# Patient Record
Sex: Female | Born: 1985 | Race: Black or African American | Hispanic: No | Marital: Single | Smoking: Never smoker
Health system: Southern US, Community
[De-identification: ages and names within clinical notes are randomized; demographics above are authoritative.]

## PROBLEM LIST (undated history)

## (undated) DIAGNOSIS — E119 Type 2 diabetes mellitus without complications: Secondary | ICD-10-CM

## (undated) DIAGNOSIS — K219 Gastro-esophageal reflux disease without esophagitis: Secondary | ICD-10-CM

## (undated) DIAGNOSIS — I10 Essential (primary) hypertension: Secondary | ICD-10-CM

## (undated) HISTORY — PX: OVARIAN CYST REMOVAL: SHX89

## (undated) HISTORY — DX: Essential (primary) hypertension: I10

## (undated) HISTORY — DX: Type 2 diabetes mellitus without complications: E11.9

## (undated) HISTORY — PX: CYST EXCISION: SHX5701

---

## 2010-01-13 ENCOUNTER — Emergency Department: Payer: Self-pay | Admitting: Emergency Medicine

## 2010-04-20 ENCOUNTER — Emergency Department: Payer: Self-pay | Admitting: Emergency Medicine

## 2010-06-20 ENCOUNTER — Emergency Department: Payer: Self-pay | Admitting: Internal Medicine

## 2013-02-17 LAB — HM HIV SCREENING LAB: HM HIV Screening: NEGATIVE

## 2014-06-05 DIAGNOSIS — I10 Essential (primary) hypertension: Secondary | ICD-10-CM | POA: Insufficient documentation

## 2014-06-05 DIAGNOSIS — E109 Type 1 diabetes mellitus without complications: Secondary | ICD-10-CM | POA: Insufficient documentation

## 2015-07-17 ENCOUNTER — Emergency Department: Payer: Commercial Managed Care - HMO | Admitting: Anesthesiology

## 2015-07-17 ENCOUNTER — Encounter
Admission: EM | Disposition: A | Discharge status: Home / Self Care | Payer: Self-pay | Source: Home / Self Care | Attending: Obstetrics & Gynecology

## 2015-07-17 ENCOUNTER — Emergency Department: Payer: Commercial Managed Care - HMO

## 2015-07-17 ENCOUNTER — Encounter: Payer: Self-pay | Admitting: Emergency Medicine

## 2015-07-17 ENCOUNTER — Inpatient Hospital Stay
Admission: EM | Admit: 2015-07-17 | Discharge: 2015-07-19 | DRG: 742 | Disposition: A | Discharge status: Home / Self Care | Payer: Commercial Managed Care - HMO | Attending: Obstetrics & Gynecology | Admitting: Obstetrics & Gynecology

## 2015-07-17 DIAGNOSIS — N83511 Torsion of right ovary and ovarian pedicle: Secondary | ICD-10-CM | POA: Diagnosis present

## 2015-07-17 DIAGNOSIS — D271 Benign neoplasm of left ovary: Secondary | ICD-10-CM

## 2015-07-17 DIAGNOSIS — N83519 Torsion of ovary and ovarian pedicle, unspecified side: Secondary | ICD-10-CM | POA: Diagnosis present

## 2015-07-17 DIAGNOSIS — D270 Benign neoplasm of right ovary: Secondary | ICD-10-CM | POA: Diagnosis present

## 2015-07-17 DIAGNOSIS — Z6841 Body Mass Index (BMI) 40.0 and over, adult: Secondary | ICD-10-CM

## 2015-07-17 DIAGNOSIS — Z9889 Other specified postprocedural states: Secondary | ICD-10-CM

## 2015-07-17 DIAGNOSIS — R1031 Right lower quadrant pain: Secondary | ICD-10-CM

## 2015-07-17 HISTORY — PX: OVARIAN CYST REMOVAL: SHX89

## 2015-07-17 HISTORY — PX: LAPAROTOMY: SHX154

## 2015-07-17 LAB — COMPREHENSIVE METABOLIC PANEL
ALT: 21 U/L (ref 14–54)
AST: 23 U/L (ref 15–41)
Albumin: 3.7 g/dL (ref 3.5–5.0)
Alkaline Phosphatase: 66 U/L (ref 38–126)
Anion gap: 7 (ref 5–15)
BUN: 7 mg/dL (ref 6–20)
CO2: 22 mmol/L (ref 22–32)
Calcium: 8.9 mg/dL (ref 8.9–10.3)
Chloride: 110 mmol/L (ref 101–111)
Creatinine, Ser: 0.81 mg/dL (ref 0.44–1.00)
GFR calc Af Amer: >60 mL/min (ref >60)
GFR calc non Af Amer: >60 mL/min (ref >60)
Glucose, Bld: 119 mg/dL — ABNORMAL HIGH (ref 65–99)
Potassium: 3.5 mmol/L (ref 3.5–5.1)
Sodium: 139 mmol/L (ref 135–145)
Total Bilirubin: 0.2 mg/dL — ABNORMAL LOW (ref 0.3–1.2)
Total Protein: 8.2 g/dL — ABNORMAL HIGH (ref 6.5–8.1)

## 2015-07-17 LAB — URINALYSIS COMPLETE WITH MICROSCOPIC (ARMC ONLY)
Bacteria, UA: NONE SEEN
Bilirubin Urine: NEGATIVE
Glucose, UA: NEGATIVE mg/dL
Hgb urine dipstick: NEGATIVE
Ketones, ur: NEGATIVE mg/dL
Leukocytes, UA: NEGATIVE
Nitrite: NEGATIVE
Protein, ur: NEGATIVE mg/dL
Specific Gravity, Urine: 1.027 (ref 1.005–1.030)
pH: 5 (ref 5.0–8.0)

## 2015-07-17 LAB — WET PREP, GENITAL
Clue Cells Wet Prep HPF POC: NONE SEEN
Sperm: NONE SEEN
Trich, Wet Prep: NONE SEEN
Yeast Wet Prep HPF POC: NONE SEEN

## 2015-07-17 LAB — POCT PREGNANCY, URINE: Preg Test, Ur: NEGATIVE

## 2015-07-17 LAB — CBC
HCT: 35 % (ref 35.0–47.0)
Hemoglobin: 11.5 g/dL — ABNORMAL LOW (ref 12.0–16.0)
MCH: 26 pg (ref 26.0–34.0)
MCHC: 32.9 g/dL (ref 32.0–36.0)
MCV: 79 fL — ABNORMAL LOW (ref 80.0–100.0)
Platelets: 445 10*3/uL — ABNORMAL HIGH (ref 150–440)
RBC: 4.43 MIL/uL (ref 3.80–5.20)
RDW: 16.9 % — ABNORMAL HIGH (ref 11.5–14.5)
WBC: 12.2 10*3/uL — ABNORMAL HIGH (ref 3.6–11.0)

## 2015-07-17 LAB — CHLAMYDIA/NGC RT PCR (ARMC ONLY)
Chlamydia Tr: NOT DETECTED
N gonorrhoeae: NOT DETECTED

## 2015-07-17 LAB — LIPASE, BLOOD: Lipase: 24 U/L (ref 11–51)

## 2015-07-17 SURGERY — LAPAROTOMY
Anesthesia: General | Wound class: Clean Contaminated

## 2015-07-17 MED ORDER — DEXAMETHASONE SODIUM PHOSPHATE 10 MG/ML IJ SOLN
INTRAMUSCULAR | Status: DC | PRN
  Administered 2015-07-17: 10 mg via INTRAVENOUS

## 2015-07-17 MED ORDER — ONDANSETRON HCL 4 MG PO TABS: 4.0000 mg | ORAL_TABLET | Freq: Four times a day (QID) | ORAL | Status: DC | PRN

## 2015-07-17 MED ORDER — ACETAMINOPHEN 10 MG/ML IV SOLN
INTRAVENOUS | Status: AC
  Filled 2015-07-17: qty 100

## 2015-07-17 MED ORDER — OXYCODONE-ACETAMINOPHEN 5-325 MG PO TABS
1.0000 | ORAL_TABLET | ORAL | Status: DC | PRN
  Administered 2015-07-17 – 2015-07-19 (×10): 2 via ORAL
  Filled 2015-07-17 (×10): qty 2

## 2015-07-17 MED ORDER — FENTANYL CITRATE (PF) 100 MCG/2ML IJ SOLN
25.0000 ug | INTRAMUSCULAR | Status: AC | PRN
  Administered 2015-07-17 (×6): 25 ug via INTRAVENOUS

## 2015-07-17 MED ORDER — NEOSTIGMINE METHYLSULFATE 10 MG/10ML IV SOLN
INTRAVENOUS | Status: DC | PRN
  Administered 2015-07-17: 5 mg via INTRAVENOUS

## 2015-07-17 MED ORDER — METHYLPREDNISOLONE SODIUM SUCC 125 MG IJ SOLR
INTRAMUSCULAR | Status: DC | PRN
  Administered 2015-07-17: 125 mg via INTRAVENOUS

## 2015-07-17 MED ORDER — ONDANSETRON HCL 4 MG/2ML IJ SOLN: 4.0000 mg | Freq: Four times a day (QID) | INTRAMUSCULAR | Status: DC | PRN

## 2015-07-17 MED ORDER — DEXTROSE 5 % IV SOLN
2.0000 g | INTRAVENOUS | Status: AC
  Administered 2015-07-17: 2 g via INTRAVENOUS
  Filled 2015-07-17: qty 2

## 2015-07-17 MED ORDER — DOCUSATE SODIUM 100 MG PO CAPS
100.0000 mg | ORAL_CAPSULE | Freq: Two times a day (BID) | ORAL | Status: DC
  Administered 2015-07-18 – 2015-07-19 (×3): 100 mg via ORAL
  Filled 2015-07-17 (×3): qty 1

## 2015-07-17 MED ORDER — KETOROLAC TROMETHAMINE 30 MG/ML IJ SOLN
30.0000 mg | Freq: Four times a day (QID) | INTRAMUSCULAR | Status: AC
  Administered 2015-07-17 – 2015-07-18 (×4): 30 mg via INTRAVENOUS
  Filled 2015-07-17 (×4): qty 1

## 2015-07-17 MED ORDER — ROCURONIUM BROMIDE 100 MG/10ML IV SOLN
INTRAVENOUS | Status: DC | PRN
  Administered 2015-07-17 (×2): 20 mg via INTRAVENOUS
  Administered 2015-07-17: 10 mg via INTRAVENOUS

## 2015-07-17 MED ORDER — ONDANSETRON HCL 4 MG/2ML IJ SOLN: 4.0000 mg | Freq: Once | INTRAMUSCULAR | Status: DC | PRN

## 2015-07-17 MED ORDER — MORPHINE SULFATE (PF) 2 MG/ML IV SOLN: 1.0000 mg | INTRAVENOUS | Status: DC | PRN

## 2015-07-17 MED ORDER — SENNOSIDES-DOCUSATE SODIUM 8.6-50 MG PO TABS: 1.0000 | ORAL_TABLET | Freq: Every evening | ORAL | Status: DC | PRN

## 2015-07-17 MED ORDER — ONDANSETRON HCL 4 MG/2ML IJ SOLN
4.0000 mg | Freq: Once | INTRAMUSCULAR | Status: AC
  Administered 2015-07-17: 4 mg via INTRAVENOUS
  Filled 2015-07-17: qty 2

## 2015-07-17 MED ORDER — MIDAZOLAM HCL 2 MG/2ML IJ SOLN
INTRAMUSCULAR | Status: DC | PRN
  Administered 2015-07-17: 1 mg via INTRAVENOUS

## 2015-07-17 MED ORDER — IOHEXOL 240 MG/ML SOLN
25.0000 mL | Freq: Once | INTRAMUSCULAR | Status: AC | PRN
  Administered 2015-07-17: 25 mL via ORAL

## 2015-07-17 MED ORDER — LACTATED RINGERS IV SOLN
INTRAVENOUS | Status: DC
  Administered 2015-07-17: 1000 mL via INTRAVENOUS

## 2015-07-17 MED ORDER — SIMETHICONE 80 MG PO CHEW
80.0000 mg | CHEWABLE_TABLET | Freq: Four times a day (QID) | ORAL | Status: DC | PRN
  Administered 2015-07-18 (×3): 80 mg via ORAL
  Filled 2015-07-17 (×3): qty 1

## 2015-07-17 MED ORDER — SODIUM CHLORIDE 0.9 % IV BOLUS (SEPSIS)
1000.0000 mL | Freq: Once | INTRAVENOUS | Status: AC
  Administered 2015-07-17: 1000 mL via INTRAVENOUS

## 2015-07-17 MED ORDER — FENTANYL CITRATE (PF) 100 MCG/2ML IJ SOLN
INTRAMUSCULAR | Status: AC
  Filled 2015-07-17: qty 2

## 2015-07-17 MED ORDER — ONDANSETRON 4 MG PO TBDP
ORAL_TABLET | ORAL | Status: AC
  Filled 2015-07-17: qty 1

## 2015-07-17 MED ORDER — HYDROMORPHONE HCL 1 MG/ML IJ SOLN: 0.2500 mg | INTRAMUSCULAR | Status: DC | PRN

## 2015-07-17 MED ORDER — ACETAMINOPHEN 325 MG PO TABS: 650.0000 mg | ORAL_TABLET | ORAL | Status: DC | PRN

## 2015-07-17 MED ORDER — ONDANSETRON 4 MG PO TBDP
4.0000 mg | ORAL_TABLET | Freq: Once | ORAL | Status: AC | PRN
  Administered 2015-07-17: 4 mg via ORAL

## 2015-07-17 MED ORDER — LIDOCAINE HCL (CARDIAC) 20 MG/ML IV SOLN
INTRAVENOUS | Status: DC | PRN
  Administered 2015-07-17: 80 mg via INTRAVENOUS

## 2015-07-17 MED ORDER — MORPHINE SULFATE (PF) 4 MG/ML IV SOLN
4.0000 mg | Freq: Once | INTRAVENOUS | Status: AC
  Administered 2015-07-17: 4 mg via INTRAVENOUS
  Filled 2015-07-17: qty 1

## 2015-07-17 MED ORDER — BISACODYL 10 MG RE SUPP: 10.0000 mg | Freq: Every day | RECTAL | Status: DC | PRN

## 2015-07-17 MED ORDER — LACTATED RINGERS IV SOLN
INTRAVENOUS | Status: DC
  Administered 2015-07-17 – 2015-07-18 (×2): via INTRAVENOUS

## 2015-07-17 MED ORDER — SUCCINYLCHOLINE CHLORIDE 20 MG/ML IJ SOLN
INTRAMUSCULAR | Status: DC | PRN
  Administered 2015-07-17: 200 mg via INTRAVENOUS

## 2015-07-17 MED ORDER — FENTANYL CITRATE (PF) 100 MCG/2ML IJ SOLN
INTRAMUSCULAR | Status: DC | PRN
  Administered 2015-07-17 (×2): 50 ug via INTRAVENOUS

## 2015-07-17 MED ORDER — IOHEXOL 350 MG/ML SOLN
100.0000 mL | Freq: Once | INTRAVENOUS | Status: AC | PRN
  Administered 2015-07-17: 100 mL via INTRAVENOUS

## 2015-07-17 MED ORDER — HYDROMORPHONE HCL 1 MG/ML IJ SOLN
1.0000 mg | Freq: Once | INTRAMUSCULAR | Status: AC
  Administered 2015-07-17: 1 mg via INTRAVENOUS
  Filled 2015-07-17: qty 1

## 2015-07-17 MED ORDER — PROPOFOL 10 MG/ML IV BOLUS
INTRAVENOUS | Status: DC | PRN
  Administered 2015-07-17: 170 mg via INTRAVENOUS

## 2015-07-17 MED ORDER — GLYCOPYRROLATE 0.2 MG/ML IJ SOLN
INTRAMUSCULAR | Status: DC | PRN
  Administered 2015-07-17: 0.6 mg via INTRAVENOUS

## 2015-07-17 MED ORDER — ACETAMINOPHEN 10 MG/ML IV SOLN
INTRAVENOUS | Status: DC | PRN
  Administered 2015-07-17: 1000 mg via INTRAVENOUS

## 2015-07-17 MED ORDER — BUPIVACAINE HCL (PF) 0.5 % IJ SOLN
INTRAMUSCULAR | Status: AC
  Filled 2015-07-17: qty 30

## 2015-07-17 SURGICAL SUPPLY — 50 items
BAG COUNTER SPONGE EZ (MISCELLANEOUS) ×3 IMPLANT
BLADE SURG SZ11 CARB STEEL (BLADE) ×4 IMPLANT
CANISTER SUCT 1200ML W/VALVE (MISCELLANEOUS) ×4 IMPLANT
CATH ROBINSON RED A/P 16FR (CATHETERS) IMPLANT
CHLORAPREP W/TINT 26ML (MISCELLANEOUS) ×4 IMPLANT
CIPRS1030 IMPLANT
COUNTER SPONGE BAG EZ (MISCELLANEOUS) ×1
DRSG TEGADERM 2-3/8X2-3/4 SM (GAUZE/BANDAGES/DRESSINGS) IMPLANT
ELECT BLADE 6.5 EXT (BLADE) ×4 IMPLANT
GAUZE SPONGE NON-WVN 2X2 STRL (MISCELLANEOUS) IMPLANT
GLOVE BIO SURGEON STRL SZ8 (GLOVE) ×20 IMPLANT
GLOVE INDICATOR 8.0 STRL GRN (GLOVE) ×4 IMPLANT
GOWN STRL REUS W/ TWL LRG LVL3 (GOWN DISPOSABLE) ×4 IMPLANT
GOWN STRL REUS W/ TWL XL LVL3 (GOWN DISPOSABLE) ×2 IMPLANT
GOWN STRL REUS W/TWL LRG LVL3 (GOWN DISPOSABLE) ×4
GOWN STRL REUS W/TWL XL LVL3 (GOWN DISPOSABLE) ×2
IRRIGATION STRYKERFLOW (MISCELLANEOUS) IMPLANT
IRRIGATOR STRYKERFLOW (MISCELLANEOUS)
IV LACTATED RINGERS 1000ML (IV SOLUTION) IMPLANT
KIT PREVENA INCISION MGT 13 (CANNISTER) ×4 IMPLANT
LABEL OR SOLS (LABEL) ×4 IMPLANT
LIQUID BAND (GAUZE/BANDAGES/DRESSINGS) IMPLANT
NEEDLE VERESS 14GA 120MM (NEEDLE) IMPLANT
NS IRRIG 500ML POUR BTL (IV SOLUTION) ×8 IMPLANT
PACK GYN LAPAROSCOPIC (MISCELLANEOUS) ×4 IMPLANT
PAD OB MATERNITY 4.3X12.25 (PERSONAL CARE ITEMS) IMPLANT
PAD PREP 24X41 OB/GYN DISP (PERSONAL CARE ITEMS) ×4 IMPLANT
RETRACTOR TRAXI PANNICULUS (MISCELLANEOUS) ×2 IMPLANT
RTRCTR C-SECT PINK 34CM XLRG (MISCELLANEOUS) ×4 IMPLANT
SCISSORS METZENBAUM CVD 33 (INSTRUMENTS) IMPLANT
SHEARS HARMONIC ACE PLUS 36CM (ENDOMECHANICALS) IMPLANT
SLEEVE ENDOPATH XCEL 5M (ENDOMECHANICALS) IMPLANT
SPONGE LAP 18X18 5 PK (GAUZE/BANDAGES/DRESSINGS) ×8 IMPLANT
SPONGE VERSALON 2X2 STRL (MISCELLANEOUS)
STAPLER SKIN PROX 35W (STAPLE) ×4 IMPLANT
SUT PDS AB 1 TP1 96 (SUTURE) ×4 IMPLANT
SUT PLAIN 2 0 XLH (SUTURE) ×4 IMPLANT
SUT VIC AB 0 CT1 27 (SUTURE) ×4
SUT VIC AB 0 CT1 27XCR 8 STRN (SUTURE) ×4 IMPLANT
SUT VIC AB 2-0 UR6 27 (SUTURE) ×4 IMPLANT
SUT VIC AB 3-0 SH 27 (SUTURE) ×2
SUT VIC AB 3-0 SH 27X BRD (SUTURE) ×2 IMPLANT
SUT VIC AB 4-0 PS2 18 (SUTURE) ×8 IMPLANT
SUT VICRYL 2-0 27IN ABS (SUTURE) ×8 IMPLANT
SYRINGE 10CC LL (SYRINGE) ×4 IMPLANT
TRAXI PANNICULUS RETRACTOR (MISCELLANEOUS) ×2
TRAY FOLEY CATH SILVER 16FR LF (SET/KITS/TRAYS/PACK) ×4 IMPLANT
TROCAR ENDO BLADELESS 11MM (ENDOMECHANICALS) IMPLANT
TROCAR XCEL NON-BLD 5MMX100MML (ENDOMECHANICALS) IMPLANT
TUBING INSUFFLATOR HI FLOW (MISCELLANEOUS) IMPLANT

## 2015-07-17 NOTE — Transfer of Care (Signed)
Immediate Anesthesia Transfer of Care Note  Patient: Leslie Berger  Procedure(s) Performed: Procedure(s): LAPAROTOMY (N/A) OVARIAN CYSTECTOMY (Bilateral)  Patient Location: PACU  Anesthesia Type:General  Level of Consciousness: sedated  Airway & Oxygen Therapy: Patient Spontanous Breathing and Patient connected to face mask oxygen  Post-op Assessment: Report given to RN and Post -op Vital signs reviewed and stable  Post vital signs: Reviewed and stable  Last Vitals:  Filed Vitals:   07/17/15 1618 07/17/15 1906  BP: 105/59 157/86  Pulse: 94 78  Temp:  36.4 C  Resp: 18 16    Complications: No apparent anesthesia complications

## 2015-07-17 NOTE — Discharge Instructions (Signed)
Ovarian Cyst  An ovarian cyst is a sac filled with fluid or blood. This sac is attached to the ovary. Some cysts go away on their own. Other cysts need treatment.   HOME CARE   · Only take medicine as told by your doctor.  · Follow up with your doctor as told.  · Get regular pelvic exams and Pap tests.  GET HELP IF:  · Your periods are late, not regular, or painful.  · You stop having periods.  · Your belly (abdominal) or pelvic pain does not go away.  · Your belly becomes large or puffy (swollen).  · You have a hard time peeing (totally emptying your bladder).  · You have pressure on your bladder.  · You have pain during sex.  · You feel fullness, pressure, or discomfort in your belly.  · You lose weight for no reason.  · You feel sick most of the time.  · You have a hard time pooping (constipation).  · You do not feel like eating.  · You develop pimples (acne).  · You have an increase in hair on your body and face.  · You are gaining weight for no reason.  · You think you are pregnant.  GET HELP RIGHT AWAY IF:   · Your belly pain gets worse.  · You feel sick to your stomach (nauseous), and you throw up (vomit).  · You have a fever that comes on fast.  · You have belly pain while pooping (bowel movement).  · Your periods are heavier than usual.  MAKE SURE YOU:   · Understand these instructions.  · Will watch your condition.  · Will get help right away if you are not doing well or get worse.     This information is not intended to replace advice given to you by your health care provider. Make sure you discuss any questions you have with your health care provider.     Document Released: 10/04/2007 Document Revised: 02/05/2013 Document Reviewed: 12/23/2012  Elsevier Interactive Patient Education ©2016 [employer]

## 2015-07-17 NOTE — Anesthesia Procedure Notes (Signed)
Procedure Name: Intubation Date/Time: 07/17/2015 5:24 PM Performed by: Johnna Acosta Pre-anesthesia Checklist: Patient identified, Emergency Drugs available, Suction available, Patient being monitored and Timeout performed Patient Re-evaluated:Patient Re-evaluated prior to inductionOxygen Delivery Method: Circle system utilized Preoxygenation: Pre-oxygenation with 100% oxygen Intubation Type: IV induction, Rapid sequence and Cricoid Pressure applied Ventilation: Oral airway inserted - appropriate to patient size Laryngoscope Size: Sabra Heck and 2 Grade View: Grade II Tube type: Oral Tube size: 8.0 mm Number of attempts: 1 Airway Equipment and Method: Stylet Placement Confirmation: ETT inserted through vocal cords under direct vision,  positive ETCO2 and breath sounds checked- equal and bilateral Secured at: 22 cm Tube secured with: Tape Dental Injury: Teeth and Oropharynx as per pre-operative assessment

## 2015-07-17 NOTE — Op Note (Signed)
  Operative Note   07/17/2015  PRE-OP DIAGNOSIS: Bilateral Dermoid Ovarian Cyst, Right sided Pelvic Pain   POST-OP DIAGNOSIS: same   PROCEDURE: Procedure(s): LAPAROTOMY BILATERAL OVARIAN CYSTECTOMY   SURGEON: Barnett Applebaum, MD, FACOG  ASST:  Dr Georgianne Fick  ANESTHESIA: General   ESTIMATED BLOOD LOSS: 25 mL  COMPLICATIONS: None  DISPOSITION: PACU - hemodynamically stable.  CONDITION: stable  FINDINGS: Survey of the abdomen revealed a grossly normal uterus, tubes.  Bilateral 4-5 cm dermoid cysts seen.  No torsion.  No intra-abdominal adhesions were noted.   PROCEDURE IN DETAIL: The patient was taken to the OR where anesthesia was administed. The patient was positioned in the supine position and foley placed. . The patient was prepped and draped in the normal sterile fashion.  A midline vertical incision is made with scapel and carried down to the rectus fascia that is incised in the midline with separation of fascia and peritoneal penetration.  Retractor placed.  Bowel retracted with moist sponges.   Trendelenburg positioning.  The right ovarian cyst is identified and stabilized.  An incision is made in the surface and  cyst  is dissected free from the ovarian cortex and removed intact.  Hemostasis is visualized and assured.  Ovarian edges sutured with a 3-0 vicryl.  Same is performed on left dermoid cyst for cystectomy.  Pelvic cavity is cleaned with any fluid aspirated.  Fascia closed with a 0 PDS suture.  Subcutaneous layers closed and skin with clips.  Proveena bandage applied.  Instrument, needle, and sponge counts correct x2 at the conclusion of the case.  Pt goes to recovery room in stable condition.

## 2015-07-17 NOTE — ED Provider Notes (Addendum)
[facility] Emergency Department Provider Note   ___________________________________________  Time seen: I have reviewed the triage vital signs and the triage nursing note.  HISTORY  Chief Complaint Abdominal Pain   Historian Patient  HPI Leslie Berger is a 30 y.o. female who is here complaining of pelvic pain today. Right lower quadrant seems to be worse. She states that she notices it when she bends over and feels like she has some pain into the upper portion of her leg. No fever. She did have nausea and emesis 1 in the ED.  Pain in abdomen is moderate. She's never had this happen before. Denies pelvic discharge. Denies irregular periods. She is on Depo-Provera. Denies specific urinary symptoms. No flank pain.    History reviewed. No pertinent past medical history. Morbid obesity   There are no active problems to display for this patient.   Past Surgical History  Procedure Laterality Date  . Cyst excision      No current outpatient prescriptions on file.  Allergies Review of patient's allergies indicates no known allergies.  History reviewed. No pertinent family history.  Social History Social History  Substance Use Topics  . Smoking status: Never Smoker   . Smokeless tobacco: None  . Alcohol Use: No    Review of Systems  Constitutional: Negative for fever. Eyes: Negative for visual changes. ENT: Negative for sore throat. Cardiovascular: Negative for chest pain. Respiratory: Negative for shortness of breath. Gastrointestinal: Negative for diarrhea. Genitourinary: Negative for dysuria. Musculoskeletal: Negative for back pain. Skin: Negative for rash. Neurological: Negative for headache. 10 point Review of Systems otherwise negative ____________________________________________   PHYSICAL EXAM:  VITAL SIGNS: ED Triage Vitals  Enc Vitals Group     BP 07/17/15 0746 155/90 mmHg     Pulse Rate 07/17/15 0746 95     Resp  07/17/15 0746 20     Temp 07/17/15 0746 98.3 F (36.8 C)     Temp Source 07/17/15 0746 Oral     SpO2 07/17/15 0746 97 %     Weight 07/17/15 0746 300 lb (136.079 kg)     Height 07/17/15 0746 5\' 4"  (1.626 m)     Head Cir --      Peak Flow --      Pain Score 07/17/15 0746 7     Pain Loc --      Pain Edu? --      Excl. in [place]? --      Constitutional: Alert and oriented. Well appearing and in no distress. HEENT   Head: Normocephalic and atraumatic.      Eyes: Conjunctivae are normal. PERRL. Normal extraocular movements.      Ears:         Nose: No congestion/rhinnorhea.   Mouth/Throat: Mucous membranes are moist.   Neck: No stridor. Cardiovascular/Chest: Normal rate, regular rhythm.  No murmurs, rubs, or gallops. Respiratory: Normal respiratory effort without tachypnea nor retractions. Breath sounds are clear and equal bilaterally. No wheezes/rales/rhonchi. Gastrointestinal: Soft. morbidly obese. Moderate tenderness suprapubic and right-sided abdomen, very difficult exam due to obesity.   Genitourinary/rectal: No vaginal discharge. No vaginal bleeding. No cervical motion tenderness.  Musculoskeletal: Nontender with normal range of motion in all extremities. No joint effusions.  No lower extremity tenderness.  No edema. Neurologic:  Normal speech and language. No gross or focal neurologic deficits are appreciated. Skin:  Skin is warm, dry and intact. No rash noted. Psychiatric: Mood and affect are normal. Speech and behavior are normal. Patient exhibits  appropriate insight and judgment.  ____________________________________________   EKG I, Lisa Roca, MD, the attending physician have personally viewed and interpreted all ECGs.  None  ____________________________________________  LABS  wet prep rare white blood cells otherwise negative Parents test negative Urinalysis negative Lipase 24 Comprehensive metabolic panel without significant abnormalities White blood cell  count 12.2, hemoglobin 1.5______________________________________  RADIOLOGY All Xrays were viewed by me. Imaging interpreted by Radiologist.  CT abdomen and pelvis with contrast:  IMPRESSION: 1. Bilateral fat containing ovarian masses consistent with dermoids. The right ovary is larger than the left measuring up 7.4 cm in maximum dimension. An ultrasound could better evaluate right ovary to exclude other underlying abnormalities. 2. No other acute abnormalities.  Pelvic and transvaginal ultrasound:  IMPRESSION: Bilateral hyperechoic lesions consistent with ovarian dermoids. There appears to be decreased vascularity on the right. This raises the suspicion for possible ovarian torsion. __________________________________________  PROCEDURES  Procedure(s) performed: None  Critical Care performed: None  ____________________________________________   ED COURSE / ASSESSMENT AND PLAN  Pertinent labs & imaging results that were available during my care of the patient were reviewed by me and considered in my medical decision making (see chart for details).    Patient is here complaining of right lower quadrant pain that goes down her leg. She does not have any back pain or posterior leg pain, and I don't think this is sciatica. When I get down to it with her is sounds like it's really abdominal pain rather than leg pain. The pain hurts when she bends over and she feels it in her right lower quadrant.  There is no particular one-sided leg swelling or calf pain, no fever, no chest pain, no trouble breathing, no hypoxia and I didn't not suspicious of DVT or PE.  CT of the abdomen and pelvis showed no evidence of acute surgical emergency. There were dermoid cysts in the bilateral adnexa.  Ultrasound was taken to further evaluate.  Ultrasound confirms what appear to be ovarian dermoids bilaterally, but radiologist noted decreased vascularity on the right.  On reexamination the patient at  3 PM, patient states her pain is essentially gone at this point time. She did receive morphine and then Dilaudid.  Consult from Dr. Kenton Kingfisher pending recommendation for disposition/managment of possible right-sided ovarian torsion.    Dr. Kenton Kingfisher take patient to the OR.    CONSULTATIONS:   Dr. Kenton Kingfisher, OB/GYN on call, to come and evaluate the patient here in the emergency department.   Patient / Family / Caregiver informed of clinical course, medical decision-making process, and agree with plan.  .   ___________________________________________   FINAL CLINICAL IMPRESSION(S) / ED DIAGNOSES   Final diagnoses:  Dermoid cyst of ovary, left  Dermoid cyst of ovary, right  Right lower quadrant pain  Ovarian torsion              Note: This dictation was prepared with Dragon dictation. Any transcriptional errors that result from this process are unintentional   Lisa Roca, MD 07/17/15 1517  Lisa Roca, MD 07/17/15 [phone]

## 2015-07-17 NOTE — ED Notes (Signed)
Report given to Sharon, RN

## 2015-07-17 NOTE — ED Notes (Signed)
Pt c/o acute onset RLQ pain today. Has had nausea but no vomiting.  Also c/o pain down right leg.  Denies fevers.

## 2015-07-17 NOTE — ED Notes (Signed)
This RN called to room by pt's mother. Pt had 1 episode of vomiting, pt's mother stated to ED secretary she thought it was blood. Pt's vomit noted to be orange, per patient, she had red juice earlier today with Aleve at home. Pt also states that she has had no relief from pain medication.

## 2015-07-17 NOTE — Anesthesia Preprocedure Evaluation (Signed)
Anesthesia Evaluation  Patient identified by MRN, date of birth, ID band Patient awake    Reviewed: Allergy & Precautions, NPO status , Patient's Chart, lab work & pertinent test results  Airway Mallampati: II  TM Distance: >3 FB     Dental no notable dental hx.    Pulmonary sleep apnea ,    Pulmonary exam normal        Cardiovascular negative cardio ROS Normal cardiovascular exam     Neuro/Psych negative neurological ROS  negative psych ROS   GI/Hepatic negative GI ROS, Neg liver ROS,   Endo/Other  negative endocrine ROS  Renal/GU negative Renal ROS   Bilateral dermoid cysts    Musculoskeletal negative musculoskeletal ROS (+)   Abdominal (+) + obese,   Peds negative pediatric ROS (+)  Hematology negative hematology ROS (+)   Anesthesia Other Findings Morbid obesity  Reproductive/Obstetrics                             Anesthesia Physical Anesthesia Plan  ASA: III and emergent  Anesthesia Plan: General   Post-op Pain Management:    Induction: Intravenous, Rapid sequence and Cricoid pressure planned  Airway Management Planned: Oral ETT  Additional Equipment:   Intra-op Plan:   Post-operative Plan: Extubation in OR  Informed Consent: I have reviewed the patients History and Physical, chart, labs and discussed the procedure including the risks, benefits and alternatives for the proposed anesthesia with the patient or authorized representative who has indicated his/her understanding and acceptance.   Dental advisory given  Plan Discussed with: CRNA and Surgeon  Anesthesia Plan Comments:         Anesthesia Quick Evaluation

## 2015-07-17 NOTE — ED Notes (Signed)
Pt taken to US at this time

## 2015-07-17 NOTE — H&P (Signed)
Obstetrics & Gynecology H&P Note  Date of ADMISSION: 07/17/2015   Requesting Provider: [facility] ER Primary OBGYN: None Primary Care Provider: [facility]  Chief Complaint: Right lower quadrant pain  History of Present Illness: Ms. Novinger is a 30 y.o. G5 AA F on Depo for contraception for years with irreg rare periods (last Depo yesterday), obese, with new onset RIGHT LOWER QUADRANT PAIN beginning THIS AM which radiates down right leg and assoc w nausea, no other contex or assoc sx's, modified only by IV Dilaudid in ER.  See imaging below, no prior h/o ovarian cysts.  No pregnancies.  No previous GYN concerns.  ROS: A 12-point review of systems was performed and negative, except as stated in the above HPI.  OBGYN History: As per HPI. OB History    No data available      Past Medical History:  Obesity, Morbid type History reviewed. No pertinent past medical history.  Past Surgical History: Past Surgical History  Procedure Laterality Date  . Cyst excision  -  Chest wall     Family History:  History reviewed. No pertinent family history. She denies any female cancers, bleeding or blood clotting disorders.   Social History:  Social History   Social History  . Marital Status: Single    Spouse Name: N/A  . Number of Children: N/A  . Years of Education: N/A   Occupational History  . Not on file.   Social History Main Topics  . Smoking status: Never Smoker   . Smokeless tobacco: Not on file  . Alcohol Use: No  . Drug Use: No  . Sexual Activity: Not on file   Other Topics Concern  . Not on file   Social History Narrative  . No narrative on file    Health Maintenance:  Allergy: No Known Allergies  Current Outpatient Medications:none  Hospital Medications: Current Facility-Administered Medications  Medication Dose Route Frequency Provider Last Rate Last Dose  . [START ON 07/18/2015] cefOXitin (MEFOXIN) 2 g in dextrose 5 % 50 mL IVPB  2 g Intravenous On  Call to OR Gae Dry, MD      . lactated ringers infusion   Intravenous Continuous Gae Dry, MD      . ondansetron (ZOFRAN-ODT) 4 MG disintegrating tablet            No current outpatient prescriptions on file.   Physical Exam: Filed Vitals:   07/17/15 1100 07/17/15 1230 07/17/15 1300 07/17/15 1430  BP: 136/90 135/48 125/69 120/69  Pulse:  88  89  Temp:      TempSrc:      Resp:      Height:      Weight:      SpO2:  99%  98%    Temp:  [98.3 F (36.8 C)] 98.3 F (36.8 C) (03/18 0746) Pulse Rate:  [81-95] 89 (03/18 1430) Resp:  [20] 20 (03/18 0746) BP: (120-155)/(48-117) 120/69 mmHg (03/18 1430) SpO2:  [96 %-100 %] 98 % (03/18 1430) Weight:  [136.079 kg (300 lb)] 136.079 kg (300 lb) (03/18 0746)     No intake or output data in the 24 hours ending 07/17/15 1559   Current Vital Signs 24h Vital Sign Ranges  T 98.3 F (36.8 C) Temp  Avg: 98.3 F (36.8 C)  Min: 98.3 F (36.8 C)  Max: 98.3 F (36.8 C)  BP 120/69 mmHg BP  Min: 120/69  Max: 155/90  HR 89 Pulse  Avg: 88.3  Min: 81  Max: 95  RR 20 Resp  Avg: 20  Min: 20  Max: 20  SaO2 98 % Not Delivered SpO2  Avg: 97.7 %  Min: 96 %  Max: 100 %       24 Hour I/O Current Shift I/O  Time Ins Outs       Patient Vitals for the past 8 hrs:  BP Pulse SpO2  07/17/15 1430 120/69 mmHg 89 98 %  07/17/15 1300 125/69 mmHg - -  07/17/15 1230 (!) 135/48 mmHg 88 99 %  07/17/15 1100 136/90 mmHg - -  07/17/15 1030 130/82 mmHg 87 97 %  07/17/15 1000 (!) 134/117 mmHg 89 97 %  07/17/15 0930 (!) 154/108 mmHg 89 100 %  07/17/15 0907 - 81 96 %  07/17/15 0906 (!) 150/93 mmHg - -    Body mass index is 51.47 kg/(m^2). General appearance: Obese, well developed female in no acute distress.  Neck:  Supple, normal appearance, and no thyromegaly  Cardiovascular:Regular rate and rhythm.  No murmurs, rubs or gallops. Respiratory:  Clear to auscultation bilateral. Normal respiratory effort Abdomen: positive bowel sounds and no masses,  hernias; diffusely non tender to palpation, non distended Neuro/Psych:  Normal mood and affect.  Skin:  Warm and dry.  Lymphatic:  No inguinal lymphadenopathy.   Laboratory: Beta HCG: 0  Recent Labs Lab 07/17/15 0749  WBC 12.2*  HGB 11.5*  HCT 35.0  PLT 445*    Recent Labs Lab 07/17/15 0749  NA 139  K 3.5  CL 110  CO2 22  BUN 7  CREATININE 0.81  CALCIUM 8.9  PROT 8.2*  BILITOT 0.2*  ALKPHOS 66  ALT 21  AST 23  GLUCOSE 119*   Imaging:  See [place] reports.  Bilateral Dermoid type cysts with decreased blood flow to right (possible torsion)  Assessment: Ms. Forseth is a 30 y.o. G0 who presented to the ED with complaints of RLQ pain, acute onset; findings are consistent with bilateral ovarian dermoid cysts and right ovarian torsion.  Plan: Risks and benefits of surgery vs monitoring discussed, and due to size of ovaries and risks of torsion, decision made to proceed with surgery.  Risks of obesity and surgery and recovery counseled.  Attempt to preserve right ovary if possible. Implications for fertility discussed.  Plan laparoscopy and laparotomy options, discussed.  Will keep 1-2 nights depending on size of incisions and her recovery.  Risks of cancer low and also discussed.  Barnett Applebaum, MD [facility] OBGYN Pager [phone]

## 2015-07-18 LAB — HEMOGLOBIN: Hemoglobin: 10.8 g/dL — ABNORMAL LOW (ref 12.0–16.0)

## 2015-07-18 NOTE — Progress Notes (Signed)
1 Day Post-Op Procedure(s) (LRB): LAPAROTOMY (N/A) OVARIAN CYSTECTOMY (Bilateral)  Subjective: Patient reports incisional pain and tolerating PO.    Objective: I have reviewed patient's vital signs, intake and output, medications and labs.  Abd: Min T, ND Incision: Bandage intact, low suction output to Proveena Extr: no calf T, no edema  Assessment: s/p Procedure(s): LAPAROTOMY (N/A) OVARIAN CYSTECTOMY (Bilateral): stable  Plan: Advance diet Encourage ambulation Advance to PO medication D/C foley  LOS: 1 day    Hoyt Koch 07/18/2015, 9:39 AM

## 2015-07-18 NOTE — Progress Notes (Signed)
Per Dr.Harris continue with SCDs until pt ambulatory,  TEDs D/C .

## 2015-07-18 NOTE — Progress Notes (Signed)
Incentive Spirometer used 1500 level reached times 10

## 2015-07-19 ENCOUNTER — Encounter: Payer: Self-pay | Admitting: Obstetrics & Gynecology

## 2015-07-19 MED ORDER — MENTHOL 3 MG MT LOZG
1.0000 | LOZENGE | OROMUCOSAL | Status: DC | PRN
  Administered 2015-07-19: 3 mg via ORAL
  Filled 2015-07-19: qty 9

## 2015-07-19 MED ORDER — IBUPROFEN 600 MG PO TABS
600.0000 mg | ORAL_TABLET | Freq: Four times a day (QID) | ORAL | Status: DC | PRN
  Administered 2015-07-19 (×2): 600 mg via ORAL
  Filled 2015-07-19 (×2): qty 1

## 2015-07-19 MED ORDER — OXYCODONE-ACETAMINOPHEN 5-325 MG PO TABS
1.0000 | ORAL_TABLET | ORAL | Status: DC | PRN
Start: 2015-07-19 — End: 2019-06-16

## 2015-07-19 NOTE — Discharge Summary (Signed)
Physician Discharge Summary  Patient ID: Leslie Berger MRN: [id number] DOB/AGE: [birth year] 28 y.o.  Admit date: 07/17/2015 Discharge date: 07/19/2015  Admission Diagnoses:  Pain and cysts  Discharge Diagnoses:  Principal Problem:   Dermoid cyst of both ovaries   Right lower quadrant pain  Discharged Condition: good  Hospital Course: Surgery for bilateral dermoid cysts, requiring laparotomy.  Pt has recovered well and is able to ambulate, void, and tolerate reg diet.  Incision protected with Proveena.  Consults: None  Significant Diagnostic Studies: labs: (see report) and radiology: Ultrasound: (see report, bilateral dermoid type cysts)  Treatments: IV hydration and surgery: Laparotomy, bilateral cystectomy.  Discharge Exam: Blood pressure 120/65, pulse 93, temperature 98.3 F (36.8 C), temperature source Oral, resp. rate 18, height 5\' 4"  (1.626 m), weight 136.079 kg (300 lb), SpO2 99 %. General appearance: alert, cooperative, no distress and morbidly obese Back: negative GI: soft, non-tender; bowel sounds normal; no masses,  no organomegaly Skin: Skin color, texture, turgor normal. No rashes or lesions Incision/Wound:proveena intact and working  Disposition: Home.  Out of work 2 weeks.     Medication List    TAKE these medications        IRON PO  Take 325 mg by mouth every morning.     omeprazole 40 MG capsule  Commonly known as:  PRILOSEC  Take 40 mg by mouth daily before breakfast.     oxyCODONE-acetaminophen 5-325 MG tablet  Commonly known as:  PERCOCET  Take 1 tablet by mouth every 4 (four) hours as needed for moderate pain or severe pain.         ALSO TAKE MOTRIN 400-800 mG po every 6 hours as needed     Follow-up Information    Follow up with Hoyt Koch, MD. Go in 1 week.   APPOINTMENT 07/26/15 at 2:15.   Specialty:  Obstetrics and Gynecology   Why:  For Post Op   Contact information:   [address] [place]  [address] [phone]                         Signed: Hoyt Koch 07/19/2015, 11:00 AM

## 2015-07-19 NOTE — Progress Notes (Signed)
Patient understands all discharge instructions and the need to make follow up appointments. Patient discharge via wheelchair with auxillary. 

## 2015-07-20 LAB — SURGICAL PATHOLOGY

## 2015-07-20 NOTE — Anesthesia Postprocedure Evaluation (Signed)
Anesthesia Post Note  Patient: Corsica Turbin  Procedure(s) Performed: Procedure(s) (LRB): LAPAROTOMY (N/A) OVARIAN CYSTECTOMY (Bilateral)  Patient location during evaluation: PACU Anesthesia Type: General Level of consciousness: awake and alert and oriented Pain management: pain level controlled Vital Signs Assessment: post-procedure vital signs reviewed and stable Respiratory status: spontaneous breathing Cardiovascular status: blood pressure returned to baseline Anesthetic complications: no    Last Vitals:  Filed Vitals:   07/19/15 0306 07/19/15 0921  BP: 124/61 120/65  Pulse: 80 93  Temp: 36.9 C 36.8 C  Resp: 18 18    Last Pain:  Filed Vitals:   07/19/15 1033  PainSc: 7                  Kissy Cielo

## 2015-10-21 LAB — HM PAP SMEAR: HM Pap smear: NEGATIVE

## 2016-04-30 ENCOUNTER — Emergency Department
Admission: EM | Admit: 2016-04-30 | Discharge: 2016-05-01 | Disposition: A | Discharge status: Home / Self Care | Payer: 59 | Attending: Emergency Medicine | Admitting: Emergency Medicine

## 2016-04-30 DIAGNOSIS — R609 Edema, unspecified: Secondary | ICD-10-CM | POA: Insufficient documentation

## 2016-04-30 DIAGNOSIS — J029 Acute pharyngitis, unspecified: Secondary | ICD-10-CM | POA: Insufficient documentation

## 2016-04-30 DIAGNOSIS — K1379 Other lesions of oral mucosa: Secondary | ICD-10-CM

## 2016-04-30 DIAGNOSIS — R042 Hemoptysis: Secondary | ICD-10-CM | POA: Insufficient documentation

## 2016-04-30 HISTORY — DX: Gastro-esophageal reflux disease without esophagitis: K21.9

## 2016-05-01 ENCOUNTER — Encounter: Payer: Self-pay | Admitting: Emergency Medicine

## 2016-05-01 ENCOUNTER — Emergency Department: Payer: 59

## 2016-05-01 DIAGNOSIS — R042 Hemoptysis: Secondary | ICD-10-CM | POA: Diagnosis not present

## 2016-05-01 LAB — POCT RAPID STREP A: Streptococcus, Group A Screen (Direct): NEGATIVE

## 2016-05-01 MED ORDER — TRAMADOL HCL 50 MG PO TABS: 50.0000 mg | ORAL_TABLET | Freq: Four times a day (QID) | ORAL | 0 refills | Status: DC | PRN

## 2016-05-01 MED ORDER — DIPHENHYDRAMINE HCL 25 MG PO CAPS
25.0000 mg | ORAL_CAPSULE | Freq: Once | ORAL | Status: AC
  Administered 2016-05-01: 25 mg via ORAL
  Filled 2016-05-01: qty 1

## 2016-05-01 MED ORDER — GI COCKTAIL ~~LOC~~
30.0000 mL | Freq: Once | ORAL | Status: AC
  Administered 2016-05-01: 30 mL via ORAL
  Filled 2016-05-01: qty 30

## 2016-05-01 MED ORDER — ETODOLAC 200 MG PO CAPS: 200.0000 mg | ORAL_CAPSULE | Freq: Three times a day (TID) | ORAL | 0 refills | Status: DC

## 2016-05-01 MED ORDER — DEXAMETHASONE 10 MG/ML FOR PEDIATRIC ORAL USE
10.0000 mg | Freq: Once | INTRAMUSCULAR | Status: AC
  Administered 2016-05-01: 10 mg via ORAL
  Filled 2016-05-01: qty 1

## 2016-05-01 MED ORDER — TRAMADOL HCL 50 MG PO TABS
50.0000 mg | ORAL_TABLET | Freq: Once | ORAL | Status: AC
  Administered 2016-05-01: 50 mg via ORAL
  Filled 2016-05-01: qty 1

## 2016-05-01 MED ORDER — KETOROLAC TROMETHAMINE 60 MG/2ML IM SOLN
60.0000 mg | Freq: Once | INTRAMUSCULAR | Status: AC
  Administered 2016-05-01: 60 mg via INTRAMUSCULAR
  Filled 2016-05-01: qty 2

## 2016-05-01 NOTE — ED Triage Notes (Signed)
Patient brought in by ems from home. Patient reports that she started coughing at home and coughed up a moderate amount of bright red blood. Patient with complaint of sore throat.

## 2016-05-01 NOTE — ED Provider Notes (Signed)
[facility] Emergency Department Provider Note   ____________________________________________   First MD Initiated Contact with Patient 05/01/16 0037     (approximate)  I have reviewed the triage vital signs and the nursing notes.   HISTORY  Chief Complaint Hemoptysis and Sore Throat    HPI Leslie Berger is a 31 y.o. female who comes into the hospital today with some sore throat and hemoptysis. The patient reports that she was sleeping and she woke up coughing. She reports that when she coughs she was coughing up sputum with streaks of blood in it. Her mom reports it wasn't blood clots or a lot it was just a little bit but it was a few times. The patient reports that she developed pain in her throat when this occurred. She reports that her throat feels sore on the left side. She rates her pain a 7 out of 10 in intensity. She reports that before that she didn't have a cough. The patient denies any chest pain or shortness of breath.   Past Medical History:  Diagnosis Date  . Acid reflux     Patient Active Problem List   Diagnosis Date Noted  . Dermoid cyst of both ovaries 07/17/2015    Past Surgical History:  Procedure Laterality Date  . CYST EXCISION    . LAPAROTOMY N/A 07/17/2015   Procedure: LAPAROTOMY;  Surgeon: Gae Dry, MD;  Location: [facility] ORS;  Service: Gynecology;  Laterality: N/A;  . OVARIAN CYST REMOVAL Bilateral 07/17/2015   Procedure: OVARIAN CYSTECTOMY;  Surgeon: Gae Dry, MD;  Location: [facility] ORS;  Service: Gynecology;  Laterality: Bilateral;  . OVARIAN CYST REMOVAL      Prior to Admission medications   Medication Sig Start Date End Date Taking? Authorizing Provider  etodolac (LODINE) 200 MG capsule Take 1 capsule (200 mg total) by mouth every 8 (eight) hours. 05/01/16   Loney Hering, MD  IRON PO Take 325 mg by mouth every morning. 05/02/15 05/01/16  Historical Provider, MD  omeprazole (PRILOSEC) 40 MG capsule Take 40  mg by mouth daily before breakfast. 03/01/15 02/29/16  Historical Provider, MD  oxyCODONE-acetaminophen (PERCOCET) 5-325 MG tablet Take 1 tablet by mouth every 4 (four) hours as needed for moderate pain or severe pain. 07/19/15   Gae Dry, MD  traMADol (ULTRAM) 50 MG tablet Take 1 tablet (50 mg total) by mouth every 6 (six) hours as needed. 05/01/16   Loney Hering, MD    Allergies Patient has no known allergies.  No family history on file.  Social History Social History  Substance Use Topics  . Smoking status: Never Smoker  . Smokeless tobacco: Never Used  . Alcohol use No    Review of Systems Constitutional: No fever/chills Eyes: No visual changes. ENT:  sore throat. Cardiovascular: Denies chest pain. Respiratory: cough Gastrointestinal: No abdominal pain.  No nausea, no vomiting.  No diarrhea.  No constipation. Genitourinary: Negative for dysuria. Musculoskeletal: Negative for back pain. Skin: Negative for rash. Neurological: Negative for headaches, focal weakness or numbness.  10-point ROS otherwise negative.  ____________________________________________   PHYSICAL EXAM:  VITAL SIGNS: ED Triage Vitals  Enc Vitals Group     BP 05/01/16 0011 135/81     Pulse Rate 05/01/16 0011 (!) 105     Resp 05/01/16 0011 18     Temp 05/01/16 0011 98.3 F (36.8 C)     Temp Source 05/01/16 0011 Oral     SpO2 05/01/16 0011 100 %  Weight 05/01/16 0012 290 lb (131.5 kg)     Height 05/01/16 0012 5\' 4"  (1.626 m)     Head Circumference --      Peak Flow --      Pain Score 05/01/16 0012 6     Pain Loc --      Pain Edu? --      Excl. in [place]? --     Constitutional: Alert and oriented. Well appearing and in Mild distress. Eyes: Conjunctivae are normal. PERRL. EOMI. Head: Atraumatic. Nose: No congestion/rhinnorhea. Mouth/Throat: Mucous membranes are moist.  Oropharynx non-erythematous. Uvula edema present with some redness to her uvula Cardiovascular: Normal rate,  regular rhythm. Grossly normal heart sounds.  Good peripheral circulation. Respiratory: Normal respiratory effort.  No retractions. Lungs CTAB. Gastrointestinal: Soft and nontender. No distention.  Musculoskeletal: No lower extremity tenderness nor edema.  Neurologic:  Normal speech and language.  Skin:  Skin is warm, dry and intact. No rash noted. Psychiatric: Mood and affect are normal. Speech and behavior are normal.  ____________________________________________   LABS (all labs ordered are listed, but only abnormal results are displayed)  Labs Reviewed  POCT RAPID STREP A   ____________________________________________  EKG  none ____________________________________________  RADIOLOGY  CXR ____________________________________________   PROCEDURES  Procedure(s) performed: None  Procedures  Critical Care performed: No  ____________________________________________   INITIAL IMPRESSION / ASSESSMENT AND PLAN / ED COURSE  Pertinent labs & imaging results that were available during my care of the patient were reviewed by me and considered in my medical decision making (see chart for details).  This is a 31 year old female who comes into the hospital today with some sore throat and hemoptysis. When I did evaluate the patient she appeared to have some uvular edema with some blood vessels on the uvula. It appears as though she may have had some bleeding from the edema. The patient does snore and has sleep apnea but does not use a CPAP machine. I did give the patient dose of Decadron as well as a GI cocktail. I sent her for chest x-ray which was unremarkable. The patient received some tramadol as well as a shot of Toradol. She'll be sent home to follow-up with her primary care physician.  Clinical Course as of May 01 248  Mon May 01, 2016  0153 No radiographic evidence for acute cardiopulmonary abnormality DG Chest 2 View [AW]    Clinical Course User Index [AW] Loney Hering, MD     ____________________________________________   FINAL CLINICAL IMPRESSION(S) / ED DIAGNOSES  Final diagnoses:  Uvular edema  Hemoptysis  Sore throat      NEW MEDICATIONS STARTED DURING THIS VISIT:  New Prescriptions   ETODOLAC (LODINE) 200 MG CAPSULE    Take 1 capsule (200 mg total) by mouth every 8 (eight) hours.   TRAMADOL (ULTRAM) 50 MG TABLET    Take 1 tablet (50 mg total) by mouth every 6 (six) hours as needed.     Note:  This document was prepared using Dragon voice recognition software and may include unintentional dictation errors.    Loney Hering, MD 05/01/16 [phone]

## 2016-08-07 ENCOUNTER — Encounter: Payer: Self-pay | Admitting: Obstetrics & Gynecology

## 2016-08-07 ENCOUNTER — Ambulatory Visit (INDEPENDENT_AMBULATORY_CARE_PROVIDER_SITE_OTHER): Payer: 59 | Admitting: Obstetrics & Gynecology

## 2016-08-07 VITALS — BP 112/70 | HR 89 | Ht 64.0 in | Wt 346.0 lb

## 2016-08-07 DIAGNOSIS — D270 Benign neoplasm of right ovary: Secondary | ICD-10-CM | POA: Diagnosis not present

## 2016-08-07 DIAGNOSIS — N3000 Acute cystitis without hematuria: Secondary | ICD-10-CM

## 2016-08-07 DIAGNOSIS — R102 Pelvic and perineal pain: Secondary | ICD-10-CM | POA: Diagnosis not present

## 2016-08-07 DIAGNOSIS — Z6841 Body Mass Index (BMI) 40.0 and over, adult: Secondary | ICD-10-CM | POA: Diagnosis not present

## 2016-08-07 DIAGNOSIS — D271 Benign neoplasm of left ovary: Secondary | ICD-10-CM

## 2016-08-07 DIAGNOSIS — E669 Obesity, unspecified: Secondary | ICD-10-CM | POA: Diagnosis not present

## 2016-08-07 DIAGNOSIS — IMO0001 Reserved for inherently not codable concepts without codable children: Secondary | ICD-10-CM | POA: Insufficient documentation

## 2016-08-07 LAB — POCT URINALYSIS DIPSTICK
Bilirubin, UA: NEGATIVE
Blood, UA: NEGATIVE
Clarity, UA: NEGATIVE
Glucose, UA: NEGATIVE
Ketones, UA: NEGATIVE
Nitrite, UA: NEGATIVE
Protein, UA: NEGATIVE
Spec Grav, UA: 1.003 (ref 1.030–1.035)
Urobilinogen, UA: 1 (ref ?–2.0)
pH, UA: 5 (ref 5.0–8.0)

## 2016-08-07 MED ORDER — SULFAMETHOXAZOLE-TRIMETHOPRIM 800-160 MG PO TABS: 1.0000 | ORAL_TABLET | Freq: Two times a day (BID) | ORAL | 0 refills | Status: AC

## 2016-08-07 MED ORDER — DIETHYLPROPION HCL ER 75 MG PO TB24: 1.0000 | ORAL_TABLET | Freq: Every day | ORAL | 0 refills | Status: DC

## 2016-08-07 NOTE — Patient Instructions (Signed)

## 2016-08-07 NOTE — Progress Notes (Signed)
HPI:      [name] is a 31 y.o. G0 who LMP was  In past due to Depo use;presents today for a problem visit.    Urinary Tract Infection: Patient complains of suprapubic pressure . She has had symptoms for several weeks. Patient also complains of intermittant pains in this area beneath or laparotomy scar since surgery last year for Dermoids; mild intermittant no assoc sx's and helped w NSAIDs and even Percocet.. Patient denies back pain, cough, fever, headache, sorethroat and vaginal discharge. Patient does not have a history of recurrent UTI.  Patient does not have a history of pyelonephritis.   Weight Gain: Pt also has increase in weigth from last year, still has severe obesity concerns.  Did not like Phentermine in past despite it helping her lose weight.  No joint pains.  PMHx: She  has a past medical history of Acid reflux. Also,  has a past surgical history that includes Cyst excision; laparotomy (N/A, 07/17/2015); Ovarian cyst removal (Bilateral, 07/17/2015); and Ovarian cyst removal., family history includes Hyperlipidemia in her maternal grandfather.,  reports that she has never smoked. She has never used smokeless tobacco. She reports that she does not drink alcohol or use drugs.  She has a current medication list which includes the following prescription(s): medroxyprogesterone, diethylpropion hcl cr, etodolac, omeprazole, oxycodone-acetaminophen, sulfamethoxazole-trimethoprim, and tramadol. Also, has No Known Allergies.  Review of Systems  Constitutional: Negative for chills, fever and malaise/fatigue.  HENT: Negative for congestion, sinus pain and sore throat.   Eyes: Negative for blurred vision and pain.  Respiratory: Negative for cough and wheezing.   Cardiovascular: Negative for chest pain and leg swelling.  Gastrointestinal: Negative for abdominal pain, constipation, diarrhea, heartburn, nausea and vomiting.  Genitourinary: Negative for dysuria, frequency, hematuria and  urgency.  Musculoskeletal: Negative for back pain, joint pain, myalgias and neck pain.  Skin: Negative for itching and rash.  Neurological: Negative for dizziness, tremors and weakness.  Endo/Heme/Allergies: Does not bruise/bleed easily.  Psychiatric/Behavioral: Negative for depression. The patient is not nervous/anxious and does not have insomnia.     Objective: BP 112/70   Pulse 89   Ht 5\' 4"  (1.626 m)   Wt (!) 346 lb (156.9 kg)   BMI 59.39 kg/m  Physical Exam  Constitutional: She is oriented to person, place, and time. She appears well-developed and well-nourished. No distress.  Genitourinary: Rectum normal, vagina normal and uterus normal. Pelvic exam was performed with patient supine. There is no rash or lesion on the right labia. There is no rash or lesion on the left labia. Vagina exhibits no lesion. No bleeding in the vagina. Right adnexum does not display mass and does not display tenderness. Left adnexum does not display mass and does not display tenderness. Cervix does not exhibit motion tenderness, lesion, friability or polyp.   Uterus is mobile and midaxial. Uterus is not enlarged or exhibiting a mass.  HENT:  Head: Normocephalic and atraumatic. Head is without laceration.  Right Ear: Hearing normal.  Left Ear: Hearing normal.  Nose: No epistaxis.  No foreign bodies.  Mouth/Throat: Uvula is midline, oropharynx is clear and moist and mucous membranes are normal.  Eyes: Pupils are equal, round, and reactive to light.  Neck: Normal range of motion. Neck supple. No thyromegaly present.  Cardiovascular: Normal rate and regular rhythm.  Exam reveals no gallop and no friction rub.   No murmur heard. Pulmonary/Chest: Effort normal and breath sounds normal. No respiratory distress. She has no wheezes. Right breast  exhibits no mass, no skin change and no tenderness. Left breast exhibits no mass, no skin change and no tenderness.  Abdominal: Soft. Bowel sounds are normal. She  exhibits no distension. There is no tenderness. There is no rebound.  Musculoskeletal: Normal range of motion.  Neurological: She is alert and oriented to person, place, and time. No cranial nerve deficit.  Skin: Skin is warm and dry.  Psychiatric: She has a normal mood and affect. Judgment normal.  Vitals reviewed.  Results for orders placed or performed in visit on 08/07/16  POCT urinalysis dipstick  Result Value Ref Range   Color, UA     Clarity, UA neg    Glucose, UA neg    Bilirubin, UA neg    Ketones, UA neg    Spec Grav, UA 1.003 1.030 - 1.035   Blood, UA neg    pH, UA 5.0 5.0 - 8.0   Protein, UA neg    Urobilinogen, UA 1.0 Negative - 2.0   Nitrite, UA neg    Leukocytes, UA moderate (2+) (A) Negative    ASSESSMENT/PLAN:   Acute cystitis Weight Gain and Obesity h/o Dermoid Ovarian Cysts  Problem List Items Addressed This Visit      Genitourinary   Dermoid cyst of both ovaries   Relevant Orders   US Transvaginal Non-OB     Other   Class 3 obesity with serious comorbidity and body mass index (BMI) of 50.0 to 59.9 in adult [facility])   Relevant Medications   Diethylpropion HCl CR 75 MG TB24    Other Visit Diagnoses    Suprapubic pain    -  Primary   Relevant Orders   US Transvaginal Non-OB   POCT urinalysis dipstick (Completed)   Urine culture   Acute cystitis without hematuria       Relevant Medications   sulfamethoxazole-trimethoprim (BACTRIM DS) 800-160 MG tablet   Other Relevant Orders   Urine culture      Barnett Applebaum, MD, [facility], [place] [facility] 08/07/2016  5:25 PM

## 2016-08-10 LAB — URINE CULTURE

## 2016-09-04 ENCOUNTER — Ambulatory Visit (INDEPENDENT_AMBULATORY_CARE_PROVIDER_SITE_OTHER): Payer: 59

## 2016-09-04 ENCOUNTER — Ambulatory Visit (INDEPENDENT_AMBULATORY_CARE_PROVIDER_SITE_OTHER): Payer: 59 | Admitting: Obstetrics & Gynecology

## 2016-09-04 ENCOUNTER — Encounter: Payer: Self-pay | Admitting: Obstetrics & Gynecology

## 2016-09-04 VITALS — BP 122/70 | Ht 64.0 in | Wt 337.0 lb

## 2016-09-04 DIAGNOSIS — Z6841 Body Mass Index (BMI) 40.0 and over, adult: Secondary | ICD-10-CM | POA: Diagnosis not present

## 2016-09-04 DIAGNOSIS — D271 Benign neoplasm of left ovary: Secondary | ICD-10-CM | POA: Diagnosis not present

## 2016-09-04 DIAGNOSIS — R102 Pelvic and perineal pain: Secondary | ICD-10-CM

## 2016-09-04 DIAGNOSIS — IMO0001 Reserved for inherently not codable concepts without codable children: Secondary | ICD-10-CM

## 2016-09-04 DIAGNOSIS — E669 Obesity, unspecified: Secondary | ICD-10-CM | POA: Diagnosis not present

## 2016-09-04 DIAGNOSIS — D270 Benign neoplasm of right ovary: Secondary | ICD-10-CM | POA: Diagnosis not present

## 2016-09-04 MED ORDER — DIETHYLPROPION HCL ER 75 MG PO TB24: 1.0000 | ORAL_TABLET | Freq: Every day | ORAL | 1 refills | Status: DC

## 2016-09-04 NOTE — Progress Notes (Signed)
  HPI: Pt has a h/o weight gain as well as mild lower quadrant pains and has h/o dermoid cysts (surgery last year).  Lost 9 lbs on Tenuate, no neg SE.  Ultrasound demonstrates no masses seen These findings are Pelvis normal  PMHx: She  has a past medical history of Acid reflux. Also,  has a past surgical history that includes Cyst excision; laparotomy (N/A, 07/17/2015); Ovarian cyst removal (Bilateral, 07/17/2015); and Ovarian cyst removal., family history includes Hyperlipidemia in her maternal grandfather.,  reports that she has never smoked. She has never used smokeless tobacco. She reports that she does not drink alcohol or use drugs.  She has a current medication list which includes the following prescription(s): diethylpropion hcl cr, etodolac, medroxyprogesterone, omeprazole, oxycodone-acetaminophen, and tramadol. Also, has No Known Allergies.  Review of Systems  Constitutional: Negative for chills, fever and malaise/fatigue.  HENT: Negative for congestion, sinus pain and sore throat.   Eyes: Negative for blurred vision and pain.  Respiratory: Negative for cough and wheezing.   Cardiovascular: Negative for chest pain and leg swelling.  Gastrointestinal: Negative for abdominal pain, constipation, diarrhea, heartburn, nausea and vomiting.  Genitourinary: Negative for dysuria, frequency, hematuria and urgency.  Musculoskeletal: Negative for back pain, joint pain, myalgias and neck pain.  Skin: Negative for itching and rash.  Neurological: Negative for dizziness, tremors and weakness.  Endo/Heme/Allergies: Does not bruise/bleed easily.  Psychiatric/Behavioral: Negative for depression. The patient is not nervous/anxious and does not have insomnia.     Objective: BP 122/70   Ht 5\' 4"  (1.626 m)   Wt (!) 337 lb (152.9 kg)   BMI 57.85 kg/m   Physical examination Constitutional NAD, Conversant  Skin No rashes, lesions or ulceration.   Extremities: Moves all appropriately.  Normal ROM  for age. No lymphadenopathy.  Neuro: Grossly intact  Psych: Oriented to PPT.  Normal mood. Normal affect.   Assessment:  Class 3 obesity with serious comorbidity and body mass index (BMI) of 50.0 to 59.9 in adult, unspecified obesity type ([place]) - Plan: Diethylpropion HCl CR 75 MG TB24  Pelvic pain in female, monitor for now, reassured by [place] results  Barnett Applebaum, MD, [facility], [place] [facility] 09/04/2016  11:18 AM

## 2016-11-06 ENCOUNTER — Encounter: Payer: Self-pay | Admitting: Obstetrics & Gynecology

## 2016-11-06 ENCOUNTER — Ambulatory Visit (INDEPENDENT_AMBULATORY_CARE_PROVIDER_SITE_OTHER): Payer: 59 | Admitting: Obstetrics & Gynecology

## 2016-11-06 VITALS — BP 120/70 | HR 106 | Ht 64.0 in | Wt 340.0 lb

## 2016-11-06 DIAGNOSIS — IMO0001 Reserved for inherently not codable concepts without codable children: Secondary | ICD-10-CM

## 2016-11-06 DIAGNOSIS — Z6841 Body Mass Index (BMI) 40.0 and over, adult: Secondary | ICD-10-CM | POA: Diagnosis not present

## 2016-11-06 NOTE — Progress Notes (Signed)
  History of Present Illness:  Leslie Berger is a 31 y.o. who was started on  Tenuate approximately 3 months ago due to obesity/abnormal weight gain. The patient has gained 4 pounds over the past 2 mos..   She has these side effects: headache and anxiety.  Stopped medicine on her own due to SE.  PMHx: She  has a past medical history of Acid reflux. Also,  has a past surgical history that includes Cyst excision; laparotomy (N/A, 07/17/2015); Ovarian cyst removal (Bilateral, 07/17/2015); and Ovarian cyst removal., family history includes Hyperlipidemia in her maternal grandfather.,  reports that she has never smoked. She has never used smokeless tobacco. She reports that she does not drink alcohol or use drugs.  She has a current medication list which includes the following prescription(s): diethylpropion hcl cr, etodolac, medroxyprogesterone, omeprazole, oxycodone-acetaminophen, and tramadol. Also, has No Known Allergies.  Review of Systems  All other systems reviewed and are negative.   Physical Exam:  BP 120/70   Pulse (!) 106   Ht 5\' 4"  (1.626 m)   Wt (!) 340 lb (154.2 kg)   BMI 58.36 kg/m  Body mass index is 58.36 kg/m. Filed Weights   11/06/16 0955  Weight: (!) 340 lb (154.2 kg)    Physical Exam  Constitutional: She is oriented to person, place, and time. She appears well-developed and well-nourished. No distress.  Musculoskeletal: Normal range of motion.  Neurological: She is alert and oriented to person, place, and time.  Skin: Skin is warm and dry.  Psychiatric: She has a normal mood and affect.  Vitals reviewed.   Assessment: morbid obesity Medication treatment is going poorly for her.  Plan: Patient will not be continued/added to prescription appetite suppressants: (Tenuate). Will refer to Bariatrics due to level of concern and diffuclty in achieving weight loss goals.  Barnett Applebaum, MD, [facility], [facility] 11/06/2016  10:18 AM

## 2016-11-08 ENCOUNTER — Telehealth: Payer: Self-pay

## 2016-11-08 NOTE — Telephone Encounter (Signed)
Pt states PH was referring her to a Bariatric doctor and she hasn't heard anything.  Adv referral is in the process of being handled.  She will get a call from either [place] or the Bariatric office.  Pt aware Izora Gala is out of office today and will be back tomorrow pm.  Msg sent to [place].

## 2016-11-09 NOTE — Telephone Encounter (Signed)
Referral faxed. Patient must complete in-person or online seminar at [email or web address] before she will be contacted for an appointment. Patient is aware.

## 2017-01-24 ENCOUNTER — Telehealth: Payer: Self-pay

## 2017-01-24 NOTE — Telephone Encounter (Signed)
Pt requests a return call. No other details given.

## 2017-01-24 NOTE — Telephone Encounter (Signed)
Spoke w/pt. She states she was calling for her mother Yalitza Teed. Her mother was transported to ER on 01/23/17 after hours & is about to have more surgery this morning. Pt appreciative of call.

## 2017-04-16 ENCOUNTER — Telehealth: Payer: Self-pay

## 2017-04-16 NOTE — Telephone Encounter (Signed)
Pt called c question about her ref to Bariatric Clinic in [place].  She had [employer] ins and it was not covered.  Starting Jan 1st she will have [place] and it will be covered.  Per [employer] [place] ref doesn't expire until 08/03/17 so pt should be able to call the Bariatric clinic and give them her new ins information.  Pt aware.

## 2017-05-08 NOTE — Telephone Encounter (Signed)
Pt called today stating that her new insurance only covers bariatric surgery in the [facility] system. Pt requests new referral to bariatrics through [facility]. Cb# [phone]. Thank you.

## 2017-05-09 ENCOUNTER — Telehealth: Payer: Self-pay

## 2017-05-09 NOTE — Telephone Encounter (Signed)
Pt states PH had ref her to [facility].  Her ins will not cover it.  Pt needs ref to [facility] bariatric instead.  [phone]

## 2017-05-11 NOTE — Telephone Encounter (Signed)
Great. Thank you.

## 2017-05-11 NOTE — Telephone Encounter (Signed)
Pt calling to see if [facility] has sent ref to [facility].  [phone]

## 2017-05-11 NOTE — Telephone Encounter (Signed)
ok 

## 2017-05-11 NOTE — Telephone Encounter (Signed)
Do you know anything about this referral? 

## 2017-12-24 HISTORY — PX: BARIATRIC SURGERY: SHX1103

## 2017-12-26 DIAGNOSIS — Z9884 Bariatric surgery status: Secondary | ICD-10-CM | POA: Insufficient documentation

## 2017-12-26 HISTORY — DX: Bariatric surgery status: Z98.84

## 2018-04-20 ENCOUNTER — Other Ambulatory Visit: Payer: Self-pay

## 2018-04-20 DIAGNOSIS — L259 Unspecified contact dermatitis, unspecified cause: Secondary | ICD-10-CM | POA: Diagnosis not present

## 2018-04-20 DIAGNOSIS — R21 Rash and other nonspecific skin eruption: Secondary | ICD-10-CM | POA: Diagnosis present

## 2018-04-20 NOTE — ED Triage Notes (Signed)
Patient reports rash to chest that is itchy and burning.

## 2018-04-20 NOTE — ED Notes (Signed)
Pt c/o itching and burning rash on her chest; pt in no distress at stat registration; ambulatory with steady gait;

## 2018-04-21 ENCOUNTER — Emergency Department
Admission: EM | Admit: 2018-04-21 | Discharge: 2018-04-21 | Disposition: A | Discharge status: Home / Self Care | Payer: Medicaid Other | Attending: Emergency Medicine | Admitting: Emergency Medicine

## 2018-04-21 DIAGNOSIS — L259 Unspecified contact dermatitis, unspecified cause: Secondary | ICD-10-CM

## 2018-04-21 MED ORDER — BETAMETHASONE DIPROPIONATE 0.05 % EX CREA: TOPICAL_CREAM | CUTANEOUS | 0 refills | Status: DC

## 2018-04-21 NOTE — Discharge Instructions (Signed)
As we discussed, it appears that you have a rash called contact dermatitis, but it is unclear what is causing the irritation.  Please keep the area clean and dry and use the prescribed ointment as directed.  Make sure you do not get any of the ointment on your face around your eyes.  Please follow-up with either your primary care doctor or with a dermatologist such as the [place] [facility] at the contact information listed in this paperwork.  Return to the emergency department if you develop new or worsening symptoms that concern you.

## 2018-04-21 NOTE — ED Provider Notes (Signed)
[facility] Emergency Department Provider Note  ____________________________________________   First MD Initiated Contact with Patient 04/21/18 0118     (approximate)  I have reviewed the triage vital signs and the nursing notes.   HISTORY  Chief Complaint Rash    HPI Leslie Berger is a 32 y.o. female with medical history that includes primarily obesity and acid reflux.  She presents for evaluation of about a day of having an itching and burning rash on her anterior chest.  She has not come in contact with anything of which she is aware.  She does have a new body wash from [employer] but the only area affected is her upper chest.  She states it is gradual in onset and covering most of her upper chest and the symptoms are mild to moderate.  She tried taking a bath with colloidal oatmeal and applying some gentle lotion but it did not seem to help.  She took 1 Benadryl by mouth yesterday and it also did not seem to help.  She has no other lesions anywhere including in her mouth and she denies fever/chills, chest pain, shortness of breath, nausea, vomiting, and abdominal pain.  She has not had similar issues in the past and has no history of eczema.  Past Medical History:  Diagnosis Date  . Acid reflux     Patient Active Problem List   Diagnosis Date Noted  . Class 3 obesity with serious comorbidity and body mass index (BMI) of 50.0 to 59.9 in adult 08/07/2016  . Dermoid cyst of both ovaries 07/17/2015    Past Surgical History:  Procedure Laterality Date  . CYST EXCISION    . LAPAROTOMY N/A 07/17/2015   Procedure: LAPAROTOMY;  Surgeon: Gae Dry, MD;  Location: [facility] ORS;  Service: Gynecology;  Laterality: N/A;  . OVARIAN CYST REMOVAL Bilateral 07/17/2015   Procedure: OVARIAN CYSTECTOMY;  Surgeon: Gae Dry, MD;  Location: [facility] ORS;  Service: Gynecology;  Laterality: Bilateral;  . OVARIAN CYST REMOVAL      Prior to Admission  medications   Medication Sig Start Date End Date Taking? Authorizing Provider  betamethasone dipropionate (DIPROLENE) 0.05 % cream Apply a thin layer of cream twice daily to the affected area on your chest.  Continue until rash has resolved. 04/21/18   Hinda Kehr, MD  Diethylpropion HCl CR 75 MG TB24 Take 1 tablet (75 mg total) by mouth daily. 09/04/16   Gae Dry, MD  etodolac (LODINE) 200 MG capsule Take 1 capsule (200 mg total) by mouth every 8 (eight) hours. Patient not taking: Reported on 08/07/2016 05/01/16   Loney Hering, MD  medroxyPROGESTERone (DEPO-PROVERA) 150 MG/ML injection Inject into the muscle.    [provider]  omeprazole (PRILOSEC) 40 MG capsule Take 40 mg by mouth daily before breakfast. 03/01/15 02/29/16  [provider]  oxyCODONE-acetaminophen (PERCOCET) 5-325 MG tablet Take 1 tablet by mouth every 4 (four) hours as needed for moderate pain or severe pain. Patient not taking: Reported on 08/07/2016 07/19/15   Gae Dry, MD  traMADol (ULTRAM) 50 MG tablet Take 1 tablet (50 mg total) by mouth every 6 (six) hours as needed. Patient not taking: Reported on 08/07/2016 05/01/16   Loney Hering, MD    Allergies Patient has no known allergies.  Family History  Problem Relation Age of Onset  . Hyperlipidemia Maternal Grandfather     Social History Social History   Tobacco Use  . Smoking  status: Never Smoker  . Smokeless tobacco: Never Used  Substance Use Topics  . Alcohol use: No  . Drug use: No    Review of Systems Constitutional: No fever/chills ENT: No sore throat or painful lesions in the mouth. Cardiovascular: Denies chest pain except for the skin rash as described below. Respiratory: Denies shortness of breath. Gastrointestinal: No abdominal pain.  No nausea, no vomiting.   Integumentary: Itching and burning rash to the anterior chest as described above. Neurological: Negative for headaches, focal weakness or  numbness.   ____________________________________________   PHYSICAL EXAM:  VITAL SIGNS: ED Triage Vitals  Enc Vitals Group     BP 04/20/18 2319 124/76     Pulse Rate 04/20/18 2319 84     Resp 04/20/18 2319 18     Temp 04/20/18 2319 98.2 F (36.8 C)     Temp Source 04/20/18 2319 Oral     SpO2 04/20/18 2319 95 %     Weight 04/20/18 2318 122.5 kg (270 lb)     Height 04/20/18 2318 1.626 m (5\' 4" )     Head Circumference --      Peak Flow --      Pain Score 04/20/18 2318 0     Pain Loc --      Pain Edu? --      Excl. in [place]? --     Constitutional: Alert and oriented. Well appearing and in no acute distress. Eyes: Conjunctivae are normal.  Head: Atraumatic. Nose: No congestion/rhinnorhea. Mouth/Throat: Mucous membranes are moist.  Oropharynx non-erythematous.  No mucosal rash involvement. Cardiovascular: Normal rate, regular rhythm. Good peripheral circulation. Respiratory: Normal respiratory effort.  No retractions.  Neurologic:  Normal speech and language. No gross focal neurologic deficits are appreciated.  Skin:  Skin is warm, dry and intact.  She has a plaque-like rash without any erythema, fluctuance, nor induration on the anterior chest.  It does not extend to her back, her abdomen, nor her extremities.  It is most consistent with a contact dermatitis although it is covering a relatively large area. Psychiatric: Mood and affect are normal. Speech and behavior are normal.  ____________________________________________   LABS (all labs ordered are listed, but only abnormal results are displayed)  Labs Reviewed - No data to display ____________________________________________  EKG  None - EKG not ordered by ED physician ____________________________________________  RADIOLOGY   ED MD interpretation: No indication for imaging  Official radiology report(s): No results found.  ____________________________________________   PROCEDURES  Critical Care performed:  No   Procedure(s) performed:   Procedures   ____________________________________________   INITIAL IMPRESSION / ASSESSMENT AND PLAN / ED COURSE  As part of my medical decision making, I reviewed the following data within the [place] chart reviewed and Notes from prior ED visits    The symptoms are most consistent with a contact dermatitis although it is unclear what could be the source given the lack of new medications, ointments, close, etc.  It is unlikely to be the new body wash the patient is using given that it is only affecting the anterior chest.  Given that the rash is not on her face I have prescribed  betamethasone dipropionate and encouraged her to use it twice daily until she can follow-up with dermatology.  There is no involvement of the oral mucosa and there is no evidence of an emergent rash such as Stevens-Johnson's or any other rashes such as erythema migrans, erythema nodosum, etc.  I gave my  usual customary return precautions and she understands and agrees with the plan.     ____________________________________________  FINAL CLINICAL IMPRESSION(S) / ED DIAGNOSES  Final diagnoses:  Contact dermatitis, unspecified contact dermatitis type, unspecified trigger     MEDICATIONS GIVEN DURING THIS VISIT:  Medications - No data to display   ED Discharge Orders         Ordered    betamethasone dipropionate (DIPROLENE) 0.05 % cream     04/21/18 0155           Note:  This document was prepared using Dragon voice recognition software and may include unintentional dictation errors.    Hinda Kehr, MD 04/21/18 [phone]

## 2018-11-06 ENCOUNTER — Telehealth: Payer: Self-pay

## 2018-11-06 DIAGNOSIS — I10 Essential (primary) hypertension: Secondary | ICD-10-CM

## 2018-11-07 ENCOUNTER — Ambulatory Visit: Payer: Self-pay

## 2018-11-07 NOTE — Telephone Encounter (Signed)
TC with patient. Depo r/s for 11/11/18 Aileen Fass, RN

## 2018-11-11 ENCOUNTER — Ambulatory Visit (LOCAL_COMMUNITY_HEALTH_CENTER): Payer: Medicaid Other

## 2018-11-11 ENCOUNTER — Other Ambulatory Visit: Payer: Self-pay

## 2018-11-11 VITALS — BP 124/86 | Ht 65.0 in | Wt 246.5 lb

## 2018-11-11 DIAGNOSIS — Z3009 Encounter for other general counseling and advice on contraception: Secondary | ICD-10-CM | POA: Diagnosis not present

## 2018-11-11 DIAGNOSIS — Z30013 Encounter for initial prescription of injectable contraceptive: Secondary | ICD-10-CM

## 2018-11-11 MED ORDER — MEDROXYPROGESTERONE ACETATE 150 MG/ML IM SUSP
150.0000 mg | Freq: Once | INTRAMUSCULAR | Status: AC
  Administered 2018-11-11: 150 mg via INTRAMUSCULAR

## 2018-11-11 NOTE — Progress Notes (Signed)
Depo administered per 03/06/2018 written order by Antoine Primas PA and client tolerated without complaint.

## 2018-12-03 NOTE — Addendum Note (Signed)
Addended by: Rich Number on: 12/03/2018 07:27 PM   Modules accepted: Level of Service

## 2019-01-27 ENCOUNTER — Other Ambulatory Visit: Payer: Self-pay

## 2019-01-27 ENCOUNTER — Ambulatory Visit (LOCAL_COMMUNITY_HEALTH_CENTER): Payer: Medicaid Other

## 2019-01-27 VITALS — BP 137/100 | Ht 65.0 in | Wt 250.0 lb

## 2019-01-27 DIAGNOSIS — Z3009 Encounter for other general counseling and advice on contraception: Secondary | ICD-10-CM

## 2019-01-27 DIAGNOSIS — Z30013 Encounter for initial prescription of injectable contraceptive: Secondary | ICD-10-CM

## 2019-01-27 MED ORDER — MEDROXYPROGESTERONE ACETATE 150 MG/ML IM SUSP
150.0000 mg | Freq: Once | INTRAMUSCULAR | Status: AC
  Administered 2019-01-27: 150 mg via INTRAMUSCULAR

## 2019-01-27 NOTE — Progress Notes (Signed)
Patient instructed to f/u with PCP re: BP ([facility]). Depo given per C. Glendo PA order. Tolerated well Aileen Fass, RN

## 2019-04-04 ENCOUNTER — Other Ambulatory Visit: Payer: Self-pay

## 2019-04-04 DIAGNOSIS — Z20822 Contact with and (suspected) exposure to covid-19: Secondary | ICD-10-CM

## 2019-04-08 LAB — NOVEL CORONAVIRUS, NAA: SARS-CoV-2, NAA: NOT DETECTED

## 2019-04-09 ENCOUNTER — Telehealth: Payer: Self-pay

## 2019-04-09 NOTE — Telephone Encounter (Signed)
Negative COVID results given. Patient results "NOT Detected." Caller expressed understanding. ° °

## 2019-04-17 ENCOUNTER — Other Ambulatory Visit: Payer: Self-pay

## 2019-04-17 ENCOUNTER — Ambulatory Visit (LOCAL_COMMUNITY_HEALTH_CENTER): Payer: Medicaid Other

## 2019-04-17 VITALS — BP 130/80 | Ht 65.0 in | Wt 259.0 lb

## 2019-04-17 DIAGNOSIS — Z30013 Encounter for initial prescription of injectable contraceptive: Secondary | ICD-10-CM | POA: Diagnosis not present

## 2019-04-17 DIAGNOSIS — Z3009 Encounter for other general counseling and advice on contraception: Secondary | ICD-10-CM

## 2019-04-17 MED ORDER — MEDROXYPROGESTERONE ACETATE 150 MG/ML IM SUSP
150.0000 mg | Freq: Once | INTRAMUSCULAR | Status: AC
  Administered 2019-04-17: 150 mg via INTRAMUSCULAR

## 2019-04-17 NOTE — Progress Notes (Signed)
Patient mentioned that she had weight loss surgery a year ago. BP was improved today in comparison to last Depo visit.  Explained that patient needs to see provider at next visit for Depo and PE. Depo given per C.Hampton PA VO.  Aileen Fass, RN

## 2019-05-05 ENCOUNTER — Other Ambulatory Visit: Payer: Self-pay

## 2019-05-05 ENCOUNTER — Ambulatory Visit (LOCAL_COMMUNITY_HEALTH_CENTER): Payer: Medicaid Other | Admitting: Physician Assistant

## 2019-05-05 VITALS — BP 144/96 | Ht 65.0 in | Wt 258.0 lb

## 2019-05-05 DIAGNOSIS — Z9884 Bariatric surgery status: Secondary | ICD-10-CM

## 2019-05-05 DIAGNOSIS — Z3009 Encounter for other general counseling and advice on contraception: Secondary | ICD-10-CM | POA: Diagnosis not present

## 2019-05-05 DIAGNOSIS — Z3042 Encounter for surveillance of injectable contraceptive: Secondary | ICD-10-CM

## 2019-05-05 DIAGNOSIS — Z01419 Encounter for gynecological examination (general) (routine) without abnormal findings: Secondary | ICD-10-CM | POA: Diagnosis not present

## 2019-05-05 MED ORDER — MULTI-VITAMIN/MINERALS PO TABS: 1.0000 | ORAL_TABLET | Freq: Every day | ORAL | 0 refills | Status: DC

## 2019-05-05 MED ORDER — MEDROXYPROGESTERONE ACETATE 150 MG/ML IM SUSP
150.0000 mg | INTRAMUSCULAR | Status: AC
  Administered 2019-07-18 – 2019-10-08 (×2): 150 mg via INTRAMUSCULAR

## 2019-05-05 NOTE — Progress Notes (Signed)
Here for PE. Declines HIV/RPR. Aileen Fass, RN

## 2019-05-06 ENCOUNTER — Encounter: Payer: Self-pay | Admitting: Physician Assistant

## 2019-05-06 NOTE — Progress Notes (Signed)
Family Planning Visit- Repeat Yearly Visit  Subjective:  Leslie Berger is a 34 y.o. being seen today for an well woman visit and to continue with Depo.    She is currently using Depo-Provera injections for pregnancy prevention. Patient reports she does not  want a pregnancy in the next year. Patient  has Dermoid cyst of both ovaries; Class 3 obesity with serious comorbidity and body mass index (BMI) of 50.0 to 59.9 in adult; Hypertension; Diabetes mellitus type I ([place]); and History of weight loss surgery on their problem list.  Chief Complaint  Patient presents with  . Annual PE    Patient reports that she is doing well with the Depo and desires to continue with this as BCM.  States last sex was over 1 year ago.  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    Patient denies any changes to her personal or family history since last PE.  Denies any concerns today.   Does the patient desire a pregnancy in the next year? (OKQ flowsheet)  See flowsheet for other program required questions.   Body mass index is 42.93 kg/m. - Patient is eligible for diabetes screening based on BMI and age >43?  no HA1C ordered? no  Patient reports 0 of partners in last year. Desires STI screening?  No - .  Does the patient have a current or past history of drug use? No   No components found for: HCV]   Health Maintenance Due  Topic Date Due  . HEMOGLOBIN A1C  07-19-1987  . PNEUMOCOCCAL POLYSACCHARIDE VACCINE AGE 32-64 HIGH RISK  03/16/1990  . FOOT EXAM  03/16/1998  . OPHTHALMOLOGY EXAM  03/16/1998  . URINE MICROALBUMIN  03/16/1998  . PAP SMEAR-Modifier  10/21/2018  . INFLUENZA VACCINE  11/30/2018    Review of Systems  All other systems reviewed and are negative.   The following portions of the patient's history were reviewed and updated as appropriate: allergies, current medications, past family history, past medical history, past social history,  past surgical history and problem list. Problem list updated.  Objective:   Vitals:   05/05/19 1012  BP: (!) 144/96  Weight: 258 lb (117 kg)  Height: 5\' 5"  (1.651 m)    Physical Exam Vitals reviewed.  Constitutional:      General: She is not in acute distress.    Appearance: Normal appearance. She is obese.  HENT:     Head: Normocephalic and atraumatic.  Eyes:     Conjunctiva/sclera: Conjunctivae normal.  Neck:     Thyroid: No thyroid mass, thyromegaly or thyroid tenderness.  Cardiovascular:     Rate and Rhythm: Normal rate and regular rhythm.  Pulmonary:     Effort: Pulmonary effort is normal.  Abdominal:     Palpations: Abdomen is soft. There is no mass.     Tenderness: There is no abdominal tenderness. There is no guarding or rebound.  Genitourinary:    General: Normal vulva.     Rectum: Normal.     Comments: External genitalia without nits, lice, edema, erythema, lesions and inguinal adenopathy. Vagina with normal mucosa and discharge. Cervix without visible lesions. Uterus firm, mobile, nt, no masses, no CMT, no adnexal tenderness or fullness. Musculoskeletal:     Cervical back: Neck supple.  Lymphadenopathy:     Cervical: No cervical adenopathy.  Skin:    General: Skin is warm and dry.     Findings: No bruising, erythema, lesion or  rash.  Neurological:     Mental Status: She is alert and oriented to person, place, and time.  Psychiatric:        Mood and Affect: Mood normal.        Behavior: Behavior normal.        Thought Content: Thought content normal.        Judgment: Judgment normal.       Assessment and Plan:  Leslie Berger is a 34 y.o. female presenting to the [facility] Department for an initial well woman exam/family planning visit  Contraception counseling: Reviewed all forms of birth control options in the tiered based approach. available including abstinence; over the counter/barrier methods; hormonal contraceptive medication  including pill, patch, ring, injection,contraceptive implant; hormonal and nonhormonal IUDs; permanent sterilization options including vasectomy and the various tubal sterilization modalities. Risks, benefits, and typical effectiveness rates were reviewed.  Questions were answered.  Written information was also given to the patient to review.  Patient desires to continue with Depo2, this was prescribed for patient. She will follow up in  3 months and prn for surveillance.  She was told to call with any further questions, or with any concerns about this method of contraception.  Emphasized use of condoms 100% of the time for STI prevention.  Patient was not a candidate for ECP today.   1. History of weight loss surgery Enc to continue efforts to weight goals and follow up with PCP/Bariatric surgeon.  2. Encounter for counseling regarding contraception Reviewed with patient normal SE of Depo and when to call clinic for irregular bleeding. Enc to use condoms with all sex for STD protection. Enc to continue to follow up with PCP re:  Elevated BP/HTN per recs and for age appropriate screenings. - medroxyPROGESTERone (DEPO-PROVERA) injection 150 mg - Multiple Vitamins-Minerals (MULTIVITAMIN WITH MINERALS) tablet; Take 1 tablet by mouth daily.  Dispense: 100 tablet; Refill: 0  3. Pap test, as part of routine gynecological examination Counseled that RN will call or send a letter in 2-3 weeks with results and follow up plan. - IGP, Aptima HPV  4. Surveillance for Depo-Provera contraception OK for Depo 150mg  IM q 11-13 weeks for 1 year. Enc to do weight bearing exercise and get adequate calcium intake for bone health. - medroxyPROGESTERone (DEPO-PROVERA) injection 150 mg     Return in about 2 months (around 07/10/2019) for Depo.  No future appointments.  Jerene Dilling, PA

## 2019-05-08 LAB — IGP, APTIMA HPV
HPV Aptima: NEGATIVE
PAP Smear Comment: 0

## 2019-06-09 ENCOUNTER — Ambulatory Visit: Payer: Self-pay | Attending: Internal Medicine

## 2019-06-16 ENCOUNTER — Emergency Department: Payer: Medicaid Other

## 2019-06-16 ENCOUNTER — Encounter: Payer: Self-pay | Admitting: Emergency Medicine

## 2019-06-16 ENCOUNTER — Other Ambulatory Visit: Payer: Self-pay

## 2019-06-16 ENCOUNTER — Emergency Department
Admission: EM | Admit: 2019-06-16 | Discharge: 2019-06-16 | Disposition: A | Discharge status: Home / Self Care | Payer: Medicaid Other | Attending: Emergency Medicine | Admitting: Emergency Medicine

## 2019-06-16 DIAGNOSIS — J189 Pneumonia, unspecified organism: Secondary | ICD-10-CM | POA: Insufficient documentation

## 2019-06-16 DIAGNOSIS — R0602 Shortness of breath: Secondary | ICD-10-CM | POA: Diagnosis present

## 2019-06-16 DIAGNOSIS — I10 Essential (primary) hypertension: Secondary | ICD-10-CM | POA: Insufficient documentation

## 2019-06-16 DIAGNOSIS — Z79899 Other long term (current) drug therapy: Secondary | ICD-10-CM | POA: Diagnosis not present

## 2019-06-16 DIAGNOSIS — U071 COVID-19: Secondary | ICD-10-CM | POA: Diagnosis not present

## 2019-06-16 DIAGNOSIS — E119 Type 2 diabetes mellitus without complications: Secondary | ICD-10-CM | POA: Insufficient documentation

## 2019-06-16 LAB — BASIC METABOLIC PANEL
Anion gap: 9 (ref 5–15)
BUN: 10 mg/dL (ref 6–20)
CO2: 23 mmol/L (ref 22–32)
Calcium: 8.6 mg/dL — ABNORMAL LOW (ref 8.9–10.3)
Chloride: 110 mmol/L (ref 98–111)
Creatinine, Ser: 0.85 mg/dL (ref 0.44–1.00)
GFR calc Af Amer: >60 mL/min (ref >60)
GFR calc non Af Amer: >60 mL/min (ref >60)
Glucose, Bld: 101 mg/dL — ABNORMAL HIGH (ref 70–99)
Potassium: 3.5 mmol/L (ref 3.5–5.1)
Sodium: 142 mmol/L (ref 135–145)

## 2019-06-16 LAB — CBC WITH DIFFERENTIAL/PLATELET
Abs Immature Granulocytes: 0.01 10*3/uL (ref 0.00–0.07)
Basophils Absolute: 0 10*3/uL (ref 0.0–0.1)
Basophils Relative: 0 %
Eosinophils Absolute: 0 10*3/uL (ref 0.0–0.5)
Eosinophils Relative: 0 %
HCT: 39.7 % (ref 36.0–46.0)
Hemoglobin: 12.9 g/dL (ref 12.0–15.0)
Immature Granulocytes: 0 %
Lymphocytes Relative: 39 %
Lymphs Abs: 2 10*3/uL (ref 0.7–4.0)
MCH: 27.5 pg (ref 26.0–34.0)
MCHC: 32.5 g/dL (ref 30.0–36.0)
MCV: 84.6 fL (ref 80.0–100.0)
Monocytes Absolute: 0.4 10*3/uL (ref 0.1–1.0)
Monocytes Relative: 8 %
Neutro Abs: 2.7 10*3/uL (ref 1.7–7.7)
Neutrophils Relative %: 53 %
Platelets: 277 10*3/uL (ref 150–400)
RBC: 4.69 MIL/uL (ref 3.87–5.11)
RDW: 13.2 % (ref 11.5–15.5)
WBC: 5.1 10*3/uL (ref 4.0–10.5)
nRBC: 0 % (ref 0.0–0.2)

## 2019-06-16 LAB — POCT PREGNANCY, URINE: Preg Test, Ur: NEGATIVE

## 2019-06-16 LAB — FIBRIN DERIVATIVES D-DIMER (ARMC ONLY): Fibrin derivatives D-dimer (ARMC): 1017.08 ng/mL (FEU) — ABNORMAL HIGH (ref 0.00–499.00)

## 2019-06-16 MED ORDER — ACETAMINOPHEN 500 MG PO TABS
1000.0000 mg | ORAL_TABLET | Freq: Once | ORAL | Status: AC
  Administered 2019-06-16: 1000 mg via ORAL
  Filled 2019-06-16: qty 2

## 2019-06-16 MED ORDER — ALBUTEROL SULFATE HFA 108 (90 BASE) MCG/ACT IN AERS: 1.0000 | INHALATION_SPRAY | Freq: Four times a day (QID) | RESPIRATORY_TRACT | 0 refills | Status: DC | PRN

## 2019-06-16 MED ORDER — IOHEXOL 350 MG/ML SOLN
75.0000 mL | Freq: Once | INTRAVENOUS | Status: AC | PRN
  Administered 2019-06-16: 75 mL via INTRAVENOUS
  Filled 2019-06-16: qty 75

## 2019-06-16 MED ORDER — AZITHROMYCIN 250 MG PO TABS: ORAL_TABLET | ORAL | 0 refills | Status: DC

## 2019-06-16 MED ORDER — ALBUTEROL SULFATE (2.5 MG/3ML) 0.083% IN NEBU
5.0000 mg | INHALATION_SOLUTION | Freq: Once | RESPIRATORY_TRACT | Status: AC
  Administered 2019-06-16: 5 mg via RESPIRATORY_TRACT
  Filled 2019-06-16: qty 6

## 2019-06-16 MED ORDER — PREDNISONE 10 MG PO TABS: ORAL_TABLET | ORAL | 0 refills | Status: DC

## 2019-06-16 NOTE — ED Provider Notes (Signed)
[facility] Emergency Department Provider Note  ____________________________________________   First MD Initiated Contact with Patient 06/16/19 1154     (approximate)  I have reviewed the triage vital signs and the nursing notes.   HISTORY  Chief Complaint Shortness of Breath, Cough, and Generalized Body Aches   HPI Leslie Berger is a 34 y.o. female presents to the ED with complaint of shortness of breath.  Patient states that she still continues to have a cough, generalized body aches and fever.  Patient has been taking over-the-counter medication without any relief of her cough.  Patient tested positive for Covid on 06/06/2019.  Patient states that she had a history of asthma as a child.  She rates her pain as an 8 out of 10.      Past Medical History:  Diagnosis Date  . Acid reflux   . Diabetes mellitus without complication ([place])   . History of weight loss surgery 12/26/2017  . Hypertension     Patient Active Problem List   Diagnosis Date Noted  . History of weight loss surgery 12/26/2017  . Class 3 obesity with serious comorbidity and body mass index (BMI) of 50.0 to 59.9 in adult 08/07/2016  . Dermoid cyst of both ovaries 07/17/2015  . Hypertension 06/05/2014  . Diabetes mellitus type I ([place]) 06/05/2014    Past Surgical History:  Procedure Laterality Date  . CYST EXCISION    . LAPAROTOMY N/A 07/17/2015   Procedure: LAPAROTOMY;  Surgeon: Gae Dry, MD;  Location: [facility] ORS;  Service: Gynecology;  Laterality: N/A;  . OVARIAN CYST REMOVAL Bilateral 07/17/2015   Procedure: OVARIAN CYSTECTOMY;  Surgeon: Gae Dry, MD;  Location: [facility] ORS;  Service: Gynecology;  Laterality: Bilateral;  . OVARIAN CYST REMOVAL      Prior to Admission medications   Medication Sig Start Date End Date Taking? Authorizing Provider  albuterol (VENTOLIN HFA) 108 (90 Base) MCG/ACT inhaler Inhale 1-2 puffs into the lungs every 6 (six) hours as needed for  wheezing or shortness of breath. 06/16/19   Johnn Hai, PA-C  azithromycin (ZITHROMAX Z-PAK) 250 MG tablet Take 2 tablets (500 mg) on  Day 1,  followed by 1 tablet (250 mg) once daily on Days 2 through 5. 06/16/19   Johnn Hai, PA-C  Multiple Vitamins-Minerals (MULTIVITAMIN WITH MINERALS) tablet Take 1 tablet by mouth daily. Client purchases chewable multivitamin tablets.    [provider]  Multiple Vitamins-Minerals (MULTIVITAMIN WITH MINERALS) tablet Take 1 tablet by mouth daily. 05/05/19   Jerene Dilling, PA  omeprazole (PRILOSEC) 40 MG capsule Take 40 mg by mouth daily before breakfast. 03/01/15 02/29/16  [provider]  predniSONE (DELTASONE) 10 MG tablet Take 3 tablets once a day for 5 days 06/16/19   Johnn Hai, PA-C    Allergies Patient has no known allergies.  Family History  Problem Relation Age of Onset  . Hyperlipidemia Maternal Grandfather   . Hypertension Maternal Grandfather   . Hypertension Paternal Grandmother   . Hypertension Mother   . Diabetes Mother     Social History Social History   Tobacco Use  . Smoking status: Never Smoker  . Smokeless tobacco: Never Used  Substance Use Topics  . Alcohol use: No  . Drug use: No    Review of Systems Constitutional: Positive fever/chills Cardiovascular: Denies chest pain. Respiratory: Denies shortness of breath.  Positive for cough. Gastrointestinal: No abdominal pain.  No nausea, no vomiting.  No diarrhea.  Genitourinary:  Negative for dysuria. Musculoskeletal: Positive for body aches. Skin: Negative for rash. Neurological: Negative for headaches, focal weakness or numbness. ____________________________________________   PHYSICAL EXAM:  VITAL SIGNS: ED Triage Vitals  Enc Vitals Group     BP 06/16/19 1139 124/79     Pulse Rate 06/16/19 1139 (!) 109     Resp 06/16/19 1139 (!) 24     Temp 06/16/19 1139 99.2 F (37.3 C)     Temp Source 06/16/19 1139 Oral     SpO2 06/16/19  1139 100 %     Weight 06/16/19 1136 258 lb (117 kg)     Height 06/16/19 1136 5\' 4"  (1.626 m)     Head Circumference --      Peak Flow --      Pain Score 06/16/19 1135 8     Pain Loc --      Pain Edu? --      Excl. in [place]? --     Constitutional: Alert and oriented. Well appearing and in no acute distress.  Patient was currently getting a nebulizer treatment when examiner walked into the room.  Patient is able to speak in complete sentences without obvious shortness of breath. Eyes: Conjunctivae are normal. PERRL. EOMI. Head: Atraumatic. Neck: No stridor.   Hematological/Lymphatic/Immunilogical: No cervical lymphadenopathy. Cardiovascular: Normal rate, regular rhythm. Grossly normal heart sounds.  Good peripheral circulation. Respiratory: Normal respiratory effort.  No retractions. Lungs CTAB. Gastrointestinal: Soft and nontender. No distention. No abdominal bruits. No CVA tenderness. Musculoskeletal: No lower extremity tenderness nor edema.  No joint effusions. Neurologic:  Normal speech and language. No gross focal neurologic deficits are appreciated. No gait instability. Skin:  Skin is warm, dry and intact. No rash noted. Psychiatric: Mood and affect are normal. Speech and behavior are normal.  ____________________________________________   LABS (all labs ordered are listed, but only abnormal results are displayed)  Labs Reviewed  BASIC METABOLIC PANEL - Abnormal; Notable for the following components:      Result Value   Glucose, Bld 101 (*)    Calcium 8.6 (*)    All other components within normal limits  FIBRIN DERIVATIVES D-DIMER ([facility] ONLY) - Abnormal; Notable for the following components:   Fibrin derivatives D-dimer ([facility]) 1,017.08 (*)    All other components within normal limits  CBC WITH DIFFERENTIAL/PLATELET  POC URINE PREG, ED  POCT PREGNANCY, URINE   ____________________________________________  EKG  Reviewed by Dr. Jacqualine Code. Sinus tachycardia with ventricular  rate of 110, PR interval 154, QRS duration 76. ____________________________________________  RADIOLOGY  Official radiology report(s): DG Chest 1 View  Result Date: 06/16/2019 CLINICAL DATA:  Cough with shortness of breath. EXAM: CHEST  1 VIEW COMPARISON:  05/01/2016 FINDINGS: 11:58 a.m. The lungs are clear without focal pneumonia, edema, pneumothorax or pleural effusion. Cardiopericardial silhouette is at upper limits of normal for size. The visualized bony structures of the thorax are intact. IMPRESSION: No active disease. Electronically Signed   By: Misty Stanley M.D.   On: 06/16/2019 12:05   CT Angio Chest PE W and/or Wo Contrast  Result Date: 06/16/2019 CLINICAL DATA:  Shortness of breath, cough, elevated D-dimer, recent COVID-19 diagnosis 06/06/2019 EXAM: CT ANGIOGRAPHY CHEST WITH CONTRAST TECHNIQUE: Multidetector CT imaging of the chest was performed using the standard protocol during bolus administration of intravenous contrast. Multiplanar CT image reconstructions and MIPs were obtained to evaluate the vascular anatomy. CONTRAST:  16mL OMNIPAQUE IOHEXOL 350 MG/ML SOLN COMPARISON:  06/16/2019 FINDINGS: Cardiovascular: This is a technically adequate evaluation of the  pulmonary vasculature. No filling defects or pulmonary emboli. The heart is unremarkable without pericardial effusion. Thoracic aorta is normal without aneurysm or dissection. Mediastinum/Nodes: No enlarged mediastinal, hilar, or axillary lymph nodes. Thyroid gland, trachea, and esophagus demonstrate no significant findings. Lungs/Pleura: There are scattered ground-glass opacities seen bilaterally consistent with multifocal COVID-19 pneumonia. No effusion or pneumothorax. The central airways are patent. Upper Abdomen: Postsurgical changes are seen from bariatric surgery. Otherwise no acute findings. Musculoskeletal: No acute or destructive bony lesions. Reconstructed images demonstrate no additional findings. Review of the MIP images  confirms the above findings. IMPRESSION: 1. Multifocal ground-glass airspace disease bilaterally, consistent with COVID-19 pneumonia. 2. No evidence of pulmonary embolus. Electronically Signed   By: Randa Ngo M.D.   On: 06/16/2019 15:50    ____________________________________________   PROCEDURES  Procedure(s) performed (including Critical Care):  Procedures   ____________________________________________   INITIAL IMPRESSION / ASSESSMENT AND PLAN / ED COURSE  As part of my medical decision making, I reviewed the following data within the electronic MEDICAL RECORD NUMBER Notes from prior ED visits and [place] Controlled Substance Database  34 year old female presents to the ED with complaint of shortness of breath and continued cough, body aches, chills.  Patient tested positive for Covid on 06/06/2019.  She also has history of asthma and has been using her inhaler with some improvement.  Patient reports that she has been complying with quarantining.  Lab work was unremarkable with the exception of her D-dimer which was elevated at 1017.  A chest CTA showed bilateral ground glass opacities consistent with Covid pneumonia.  Patient was made aware.  She is to continue using her inhaler.  A prescription for Zithromax and prednisone was sent to her pharmacy along with a another prescription for albuterol should she run out of her inhaler that she has currently.  She is to follow-up with her PCP.  She is aware that she needs to return to the emergency department if any severe worsening of her symptoms such as difficulty breathing or increased shortness of breath.   ____________________________________________   FINAL CLINICAL IMPRESSION(S) / ED DIAGNOSES  Final diagnoses:  Pneumonia due to COVID-19 virus     ED Discharge Orders         Ordered    azithromycin (ZITHROMAX Z-PAK) 250 MG tablet     06/16/19 1619    predniSONE (DELTASONE) 10 MG tablet     06/16/19 1619    albuterol (VENTOLIN  HFA) 108 (90 Base) MCG/ACT inhaler  Every 6 hours PRN     06/16/19 1619           Note:  This document was prepared using Dragon voice recognition software and may include unintentional dictation errors.    Johnn Hai, PA-C 06/16/19 1627    Carrie Mew, MD 06/17/19 [phone]

## 2019-06-16 NOTE — ED Notes (Signed)
Pt reports previously taking dayquil with no relief, last dose yesterday. Pt reports begun taking mucinex yesterday

## 2019-06-16 NOTE — Discharge Instructions (Signed)
Follow-up with your primary care provider if any continued problems or concerns.  Begin using your inhaler if needed for shortness of breath or wheezing.  Also the Zithromax you can begin today taking 2 tablets today and then 1 tablet each day until you have completed the pack.  A prescription for prednisone was sent to your pharmacy.  This is 3 tablets 1 time a day for the next 5 days.  This should also help with your asthma and pneumonia.  Stay hydrated with drinking lots of fluids.  Return to the emergency department if any severe worsening of your symptoms or difficulty breathing.

## 2019-06-16 NOTE — ED Triage Notes (Signed)
This RN spoke with Dr. Jacqualine Code regarding care, VORB for bloodwork received.

## 2019-06-16 NOTE — ED Triage Notes (Signed)
Pt presents to ED via POV with c/o cough with clear sputum, SHOB, and generalized body aches with coughing. Pt states tested + for covid on 2/5. Pt states has been taking Mucinex for cough without relief.

## 2019-07-17 ENCOUNTER — Encounter: Payer: Self-pay | Admitting: Physician Assistant

## 2019-07-17 NOTE — Progress Notes (Signed)
Per chart review, patient had RP done 05/05/2019 with pap and order for Depo put in for 4 doses, starting in March 2021.  Provided that patient desires to continue with Depo and BP is normal, may continue Depo per that order.

## 2019-07-18 ENCOUNTER — Other Ambulatory Visit: Payer: Self-pay

## 2019-07-18 ENCOUNTER — Ambulatory Visit (LOCAL_COMMUNITY_HEALTH_CENTER): Payer: Medicaid Other

## 2019-07-18 VITALS — BP 141/92 | Ht 65.0 in | Wt 268.0 lb

## 2019-07-18 DIAGNOSIS — Z30013 Encounter for initial prescription of injectable contraceptive: Secondary | ICD-10-CM

## 2019-07-18 DIAGNOSIS — Z3042 Encounter for surveillance of injectable contraceptive: Secondary | ICD-10-CM

## 2019-07-18 DIAGNOSIS — Z3009 Encounter for other general counseling and advice on contraception: Secondary | ICD-10-CM

## 2019-07-18 NOTE — Progress Notes (Signed)
Depo given per C. Leona PA 05/05/19 order.  BP elevated as usual today- ok to continue with Depo per Lazarus Salines VO also. Aileen Fass, RN

## 2019-09-23 ENCOUNTER — Other Ambulatory Visit: Payer: Self-pay

## 2019-09-23 ENCOUNTER — Ambulatory Visit: Payer: Medicaid Other | Admitting: Dermatology

## 2019-09-23 DIAGNOSIS — L720 Epidermal cyst: Secondary | ICD-10-CM

## 2019-09-23 DIAGNOSIS — L905 Scar conditions and fibrosis of skin: Secondary | ICD-10-CM | POA: Diagnosis not present

## 2019-09-23 DIAGNOSIS — L732 Hidradenitis suppurativa: Secondary | ICD-10-CM | POA: Diagnosis not present

## 2019-09-23 MED ORDER — DOXYCYCLINE HYCLATE 100 MG PO CAPS: 100.0000 mg | ORAL_CAPSULE | Freq: Two times a day (BID) | ORAL | 1 refills | Status: AC

## 2019-09-23 MED ORDER — CLN SPORTWASH EX LIQD: 1.0000 "application " | Freq: Every day | CUTANEOUS | 3 refills | Status: DC

## 2019-09-23 NOTE — Patient Instructions (Addendum)
Start Doxycyline 100mg  1pill 2 times a day with food and drink for 2 weeks, then decrease to 1 pill once a day for 3 weeks.  Pre-Operative Instructions  You are scheduled for a surgical procedure at [facility]. We recommend you read the following instructions. If you have any questions or concerns, please call the office at [phone].  1. Shower and wash the entire body with soap and water the day of your surgery paying special attention to cleansing at and around the planned surgery site.  2. Avoid aspirin or aspirin containing products at least fourteen (14) days prior to your surgical procedure and for at least one week (7 Days) after your surgical procedure. If you take aspirin on a regular basis for heart disease or history of stroke or for any other reason, we may recommend you continue taking aspirin but please notify us if you take this on a regular basis. Aspirin can cause more bleeding to occur during surgery as well as prolonged bleeding and bruising after surgery.   3. Avoid other nonsteroidal pain medications at least one week prior to surgery and at least one week prior to your surgery. These include medications such as Ibuprofen (Motrin, Advil and Nuprin), Naprosyn, Voltaren, Relafen, etc. If medications are used for therapeutic reasons, please inform us as they can cause increased bleeding or prolonged bleeding during and bruising after surgical procedures.   4. Please advice [place] if you are taking any "blood thinner" medications such as Coumadin or Dipyridamole or Plavix or similar medications. These cause increased bleeding and prolonged bleeding during and bruising after surgical procedures. We may have to consider discontinuing these medications briefly prior to and shortly after your surgery, if safe to do so.   5. Please inform us of all medications you are currently taking. All medications that are taken regularly should be taken the day of surgery as you always do.  Nevertheless, we need to be informed of what medications you are taking prior to surgery to whether they will affect the procedure or cause any complications.   6. Please inform us of any medication allergies. Also inform us of whether you have allergies to Latex or rubber products or whether you have had any adverse reaction to Lidocaine or Epinephrine.  7. Please inform us of any prosthetic or artificial body parts such as artificial heart valve, joint replacements, etc., or similar condition that might require preoperative antibiotics.   8. We recommend avoidance of alcohol at least two weeks prior to surgery and continued avoidence for at least two weeks after surgery.   9. We recommend discontinuation of tobacco smoking at least two weeks prior to surgery and continued abstinence for at least two weeks after surgery.  10. Do not plan strenuous exercise, strenuous work or strenuous lifting for approximately four weeks after your surgery.   11. We request if you are unable to make your scheduled surgical appointment, please call us at least a week in advance or as soon as you are aware of a problem sot aht we can cancel or reschedule you.   12. You MAKE TAKE TYLENOL (acetaminophen) for pain as it is not a blood thinner.   13. PLEASE PLAN TO BE IN TOWN FOR TWO WEEKS FOLLOWING SURGERY, THIS IS IMPORTANT SO YOU CAN BE CHECKED FOR DRESSING CHANGES, SUTURE REMOVAL AND TO MONITOR FOR POSSIBLE COMPLICATIONS.  14.

## 2019-09-23 NOTE — Progress Notes (Signed)
   Follow-Up Visit   Subjective  Leslie Berger is a 34 y.o. female who presents for the following: check open sore on back (mid back spinal, hx of cyst removal 08/27/2018, pt said area has been open, draining and itchy every since cyst excision).   The following portions of the chart were reviewed this encounter and updated as appropriate:  Tobacco  Allergies  Meds  Problems  Med Hx  Surg Hx  Fam Hx      Review of Systems:  No other skin or systemic complaints except as noted in HPI or Assessment and Plan.  Objective  Well appearing patient in no apparent distress; mood and affect are within normal limits.  A focused examination was performed including back. Relevant physical exam findings are noted in the Assessment and Plan.  Objective  Mid Back Spinal: Tender red draining pap 1.0cm above excision site mid back spinal  Tender draining 2.0 x 1.0cm plaque with multiple openings below excision site mid back spinal  Images        Objective  Mid Back Spinal:   Objective  Mid Back spinal: Intact scar   Assessment & Plan  Epidermal cyst with inflammation and drainage - 1 above previous excision site; 1 below previous excision site.  Previous excision site clear and healed well. Mid Back Spinal  Inflamed  Recommend excising, will schedule for surgery and may be able to do both at the same time.   Start Doxycycline 100mg  1 po bid for 2 weeks, then decrease to 1 po qd for 3 weeks, take with food and drink, dsp #60, 1rf  Star CLN wash qd  doxycycline (VIBRAMYCIN) 100 MG capsule - Mid Back Spinal  Soap & Cleansers (CLN SPORTWASH) LIQD - Mid Back Spinal  Hidradenitis suppurativa Mid Back Spinal  With hx of cyst removed in past from mid back spinal, R medial inframammary.  Scar Mid Back spinal  2ndary to Cyst excision 08/27/18 Benign, observe  Return for to be scheduled for exc of cyst back, may do 2 at the same time per Dr. Nehemiah Massed.  I, Othelia Pulling, RMA, am acting as scribe for Sarina Ser, MD . Documentation: I have reviewed the above documentation for accuracy and completeness, and I agree with the above.  Sarina Ser, MD

## 2019-09-24 ENCOUNTER — Encounter: Payer: Self-pay | Admitting: Dermatology

## 2019-10-08 ENCOUNTER — Other Ambulatory Visit: Payer: Self-pay

## 2019-10-08 ENCOUNTER — Ambulatory Visit (LOCAL_COMMUNITY_HEALTH_CENTER): Payer: Medicaid Other

## 2019-10-08 VITALS — BP 130/84 | Ht 65.0 in | Wt 274.0 lb

## 2019-10-08 DIAGNOSIS — Z30013 Encounter for initial prescription of injectable contraceptive: Secondary | ICD-10-CM | POA: Diagnosis not present

## 2019-10-08 DIAGNOSIS — Z3009 Encounter for other general counseling and advice on contraception: Secondary | ICD-10-CM | POA: Diagnosis not present

## 2019-10-08 MED ORDER — MULTI-VITAMIN/MINERALS PO TABS: 1.0000 | ORAL_TABLET | Freq: Every day | ORAL | 0 refills | Status: DC

## 2019-10-08 NOTE — Progress Notes (Signed)
Depo administered as per 05/05/2019 written order of Antoine Primas PA. Client requested injection in left hip and tolerated without complaint. Reports received second Covid vaccine yesterday and since right arm is sore she does not want to get Depo in left arm. Rich Number, RN

## 2019-10-28 ENCOUNTER — Other Ambulatory Visit: Payer: Self-pay

## 2019-10-28 ENCOUNTER — Ambulatory Visit (INDEPENDENT_AMBULATORY_CARE_PROVIDER_SITE_OTHER): Payer: Medicaid Other | Admitting: Dermatology

## 2019-10-28 ENCOUNTER — Telehealth: Payer: Self-pay

## 2019-10-28 DIAGNOSIS — L720 Epidermal cyst: Secondary | ICD-10-CM

## 2019-10-28 MED ORDER — MUPIROCIN 2 % EX OINT: 1.0000 "application " | TOPICAL_OINTMENT | Freq: Every day | CUTANEOUS | 0 refills | Status: DC

## 2019-10-28 NOTE — Progress Notes (Signed)
Follow-Up Visit   Subjective  Leslie Berger is a 34 y.o. female who presents for the following: Cyst (mid back spinal - Excise today.). Pt seems to have Hidradenitis and cysts along her spine where her skin folds.  Previous excision site is doing well and she has just developed new areas.  The following portions of the chart were reviewed this encounter and updated as appropriate:  Tobacco  Allergies  Meds  Problems  Med Hx  Surg Hx  Fam Hx      Review of Systems:  No other skin or systemic complaints except as noted in HPI or Assessment and Plan.  Objective  Well appearing patient in no apparent distress; mood and affect are within normal limits.  A focused examination was performed including back. Relevant physical exam findings are noted in the Assessment and Plan.  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  Images       Assessment & Plan    Epidermal inclusion cyst; inflamed and ruptured with Hidradenitis Mid Back at sup spinal crease  Skin excision - Mid Back at sup spinal crease  Lesion length (cm):  2.5 Lesion width (cm):  1 Margin per side (cm):  0.2 Total excision diameter (cm):  2.9 Informed consent: discussed and consent obtained   Timeout: patient name, date of birth, surgical site, and procedure verified   Procedure prep:  Patient was prepped and draped in usual sterile fashion Prep type:  Isopropyl alcohol and povidone-iodine Anesthesia: the lesion was anesthetized in a standard fashion   Anesthetic:  1% lidocaine w/ epinephrine 1-100,000 buffered w/ 8.4% NaHCO3 Instrument used: #15 blade   Hemostasis achieved with: pressure   Hemostasis achieved with comment:  Electrocautery Outcome: patient tolerated procedure well with no complications   Post-procedure details: sterile dressing applied and wound care instructions given   Dressing type: bandage and pressure dressing  (mupirocin)    Skin repair - Mid Back at sup spinal crease Complexity:  Complex Final length (cm):  3.5 Reason for type of repair: reduce tension to allow closure, reduce the risk of dehiscence, infection, and necrosis, reduce subcutaneous dead space and avoid a hematoma, allow closure of the large defect, preserve normal anatomy, preserve normal anatomical and functional relationships and enhance both functionality and cosmetic results   Undermining: area extensively undermined   Undermining comment:  Undermining defect 1.5cm Subcutaneous layers (deep stitches):  Suture size:  2-0 Suture type: nylon   Subcutaneous suture technique: inverted dermal. Fine/surface layer approximation (top stitches):  Suture size:  2-0 Suture type: nylon   Stitches: simple running   Suture removal (days):  7 Hemostasis achieved with: suture and pressure Outcome: patient tolerated procedure well with no complications   Post-procedure details: sterile dressing applied and wound care instructions given   Dressing type: bandage and pressure dressing (mupirocin)   Additional details:  Will remove 2-0 nylon SQ suture in 2 weeks.  Doryx 200mg  1/2 tab bid x 4 days then 1/2 tab qd with food and plenty of fluid. Samples given today.  Specimen 1 - Surgical pathology Differential Diagnosis: Cyst vs other Check Margins: No 2.5 x 1.0 cm Cystic papule with ulceration  Return in about 1 week (around 11/04/2019) for for suture removal then 2 weeks for deep suture removal.  I, Ashok Cordia, CMA, am acting as scribe for Sarina Ser, MD .  Documentation: I have reviewed the above documentation for accuracy and  completeness, and I agree with the above.  Sarina Ser, MD

## 2019-10-28 NOTE — Patient Instructions (Signed)

## 2019-10-28 NOTE — Telephone Encounter (Signed)
Pt doing well after todays surgery./sh 

## 2019-11-04 ENCOUNTER — Ambulatory Visit: Payer: Medicaid Other

## 2019-11-04 ENCOUNTER — Other Ambulatory Visit: Payer: Self-pay

## 2019-11-04 DIAGNOSIS — L720 Epidermal cyst: Secondary | ICD-10-CM

## 2019-11-04 NOTE — Progress Notes (Signed)
   Follow-Up Visit   Subjective  Leslie Berger is a 34 y.o. female who presents for the following: Follow-up (Post op - Cyst of mid back at sup spinal crease).    The following portions of the chart were reviewed this encounter and updated as appropriate:      Review of Systems:  No other skin or systemic complaints except as noted in HPI or Assessment and Plan.  Objective  Well appearing patient in no apparent distress; mood and affect are within normal limits.  A focused examination was performed including back. Relevant physical exam findings are noted in the Assessment and Plan.  Objective  Mid Back at inf spinal crease, Mid Back at sup spinal crease: Healing excision site of mid back at sup spinal crease.  Papule with some bleeding today at mid back at inf spinal crease.   Assessment & Plan  Epidermal inclusion cyst (2) Mid Back at inf spinal crease; Mid Back at sup spinal crease  Excision site healing. Wound cleansed, sutures removed, steristrips applied. Will plan to remove deep sutures at appointment next week. Pathology discussed with patient.  Will plan to excise cyst at mid back at inf spinal crease in the future.  Return for suture removal as scheduled.

## 2019-11-10 ENCOUNTER — Encounter: Payer: Self-pay | Admitting: Dermatology

## 2019-11-11 ENCOUNTER — Other Ambulatory Visit: Payer: Self-pay

## 2019-11-11 ENCOUNTER — Ambulatory Visit (INDEPENDENT_AMBULATORY_CARE_PROVIDER_SITE_OTHER): Payer: Medicaid Other | Admitting: Dermatology

## 2019-11-11 DIAGNOSIS — L720 Epidermal cyst: Secondary | ICD-10-CM

## 2019-11-11 DIAGNOSIS — Z4802 Encounter for removal of sutures: Secondary | ICD-10-CM

## 2019-11-11 NOTE — Progress Notes (Signed)
   Follow-Up Visit   Subjective  Leslie Berger is a 34 y.o. female who presents for the following: post op./suture removal (patient is here today for suture removal ).  The following portions of the chart were reviewed this encounter and updated as appropriate:  Tobacco  Allergies  Meds  Problems  Med Hx  Surg Hx  Fam Hx     Review of Systems:  No other skin or systemic complaints except as noted in HPI or Assessment and Plan.  Objective  Well appearing patient in no apparent distress; mood and affect are within normal limits.  A focused examination was performed including the back . Relevant physical exam findings are noted in the Assessment and Plan.  Objective  Mid Back: Healing excision site with drainage  Assessment & Plan    Epidermal inclusion cyst Mid Back  Encounter for Removal of Sutures - Incision site at the mid back sup spinal crease is clean, dry and intact - Wound cleansed, sutures removed, wound cleansed and steri strips applied.  - Discussed pathology results showing inflamed cyst - Patient advised to keep steri-strips dry until they fall off. - Scars remodel for a full year. - Once steri-strips fall off, patient can apply over-the-counter silicone scar cream each night to help with scar remodeling if desired. - Patient advised to call with any concerns or if they notice any new or changing lesions.   Return in about 2 weeks (around 11/25/2019).  Luther Redo, CMA, am acting as scribe for Sarina Ser, MD .  Documentation: I have reviewed the above documentation for accuracy and completeness, and I agree with the above.  Sarina Ser, MD

## 2019-11-13 ENCOUNTER — Encounter: Payer: Self-pay | Admitting: Dermatology

## 2019-11-18 ENCOUNTER — Telehealth: Payer: Self-pay

## 2019-11-18 NOTE — Telephone Encounter (Signed)
Returned patient's call. She states the excision site on her back started bleeding through her bandages today. Advised patient to hold pressure on site for 15 minutes without looking at it and reapply a pressure dressing afterwards. Appointment scheduled for tomorrow with Dr Nehemiah Massed at 8:30am.

## 2019-11-19 ENCOUNTER — Ambulatory Visit (INDEPENDENT_AMBULATORY_CARE_PROVIDER_SITE_OTHER): Payer: Medicaid Other | Admitting: Dermatology

## 2019-11-19 ENCOUNTER — Other Ambulatory Visit: Payer: Self-pay

## 2019-11-19 DIAGNOSIS — L732 Hidradenitis suppurativa: Secondary | ICD-10-CM

## 2019-11-19 DIAGNOSIS — L98491 Non-pressure chronic ulcer of skin of other sites limited to breakdown of skin: Secondary | ICD-10-CM

## 2019-11-25 ENCOUNTER — Ambulatory Visit: Payer: Medicaid Other | Admitting: Dermatology

## 2019-11-26 ENCOUNTER — Ambulatory Visit: Payer: Medicaid Other | Admitting: Dermatology

## 2019-11-26 ENCOUNTER — Other Ambulatory Visit: Payer: Self-pay

## 2019-11-26 DIAGNOSIS — L732 Hidradenitis suppurativa: Secondary | ICD-10-CM

## 2019-11-26 NOTE — Progress Notes (Signed)
   Follow-Up Visit   Subjective  Leslie Berger is a 34 y.o. female who presents for the following: recheck excision site (patient states that ulceration is still draining. She is still using the Mupirocin 2% ointment daily).  The following portions of the chart were reviewed this encounter and updated as appropriate:  Tobacco  Allergies  Meds  Problems  Med Hx  Surg Hx  Fam Hx     Review of Systems:  No other skin or systemic complaints except as noted in HPI or Assessment and Plan.  Objective  Well appearing patient in no apparent distress; mood and affect are within normal limits.  A focused examination was performed including the back. Relevant physical exam findings are noted in the Assessment and Plan.  Objective  Mid back at sup spinal crease: Ulceration 3.0 x 1.0 cm    Assessment & Plan    Hidradenitis suppurativa with ulcer Mid back at sup spinal crease  At site of surgical excision (cyst) -  Continue Mupirocin 2% ointment to aa QD until healed. Will recheck ulceration in one week. Area cleansed with Puracyn spray, Mupirocin 2% ointment applied, covered with non-stick gauze, gauze, then retention tape.  Return in about 1 week (around 12/03/2019).  Luther Redo, CMA, am acting as scribe for Sarina Ser, MD .  Documentation: I have reviewed the above documentation for accuracy and completeness, and I agree with the above.  Sarina Ser, MD

## 2019-11-26 NOTE — Progress Notes (Signed)
   Follow-Up Visit   Subjective  Leslie Berger is a 34 y.o. female who presents for the following: Follow-up (Cyst excision site of back has been bleeding.).  The following portions of the chart were reviewed this encounter and updated as appropriate:  Tobacco  Allergies  Meds  Problems  Med Hx  Surg Hx  Fam Hx     Review of Systems:  No other skin or systemic complaints except as noted in HPI or Assessment and Plan.  Objective  Well appearing patient in no apparent distress; mood and affect are within normal limits.  A focused examination was performed including back. Relevant physical exam findings are noted in the Assessment and Plan.  Objective  Mid Back: 3.0 x 1.5 cm ulcer   Assessment & Plan    Non-pressure chronic ulcer of skin due to hidradenitis Suppurativa after large cyst excision Mid Back Area cleansed with Puricyn Mupirocin and dressing applied today.   Continue mupirocin with daily dressing changes.  Return for recheck as scheduled.   I, Ashok Cordia, CMA, am acting as scribe for Sarina Ser, MD .  Documentation: I have reviewed the above documentation for accuracy and completeness, and I agree with the above.  Sarina Ser, MD

## 2019-11-27 ENCOUNTER — Encounter: Payer: Self-pay | Admitting: Dermatology

## 2019-11-30 ENCOUNTER — Encounter: Payer: Self-pay | Admitting: Dermatology

## 2019-12-04 ENCOUNTER — Other Ambulatory Visit: Payer: Self-pay

## 2019-12-04 ENCOUNTER — Ambulatory Visit (INDEPENDENT_AMBULATORY_CARE_PROVIDER_SITE_OTHER): Payer: Medicaid Other | Admitting: Dermatology

## 2019-12-04 DIAGNOSIS — L732 Hidradenitis suppurativa: Secondary | ICD-10-CM

## 2019-12-04 NOTE — Progress Notes (Signed)
   Follow-Up Visit   Subjective  Leslie Berger is a 34 y.o. female who presents for the following: wound check ( recheck excision site (patient states that ulceration is still draining. She is still using the Mupirocin 2% ointment daily).).   The following portions of the chart were reviewed this encounter and updated as appropriate:      Review of Systems:  No other skin or systemic complaints except as noted in HPI or Assessment and Plan.  Objective  Well appearing patient in no apparent distress; mood and affect are within normal limits.  A focused examination was performed including face, neck, chest and back and back . Relevant physical exam findings are noted in the Assessment and Plan.  Objective  mid back at superior spinal crease: Ulceration 3.0 x 1.0 cm      Assessment & Plan  Hidradenitis suppurativa mid back at superior spinal crease  At site of surgical excision (cyst) -  Continue Mupirocin 2% ointment to aa QD until healed. Will recheck ulceration in one week. Area cleansed with Puracyn spray, Mupirocin 2% ointment applied, covered with non-stick gauze, gauze, then retention tape.    Return in about 1 week (around 12/11/2019).  IMarye Round, CMA, am acting as scribe for Sarina Ser, MD .  Documentation: I have reviewed the above documentation for accuracy and completeness, and I agree with the above.  Sarina Ser, MD

## 2019-12-06 ENCOUNTER — Encounter: Payer: Self-pay | Admitting: Dermatology

## 2019-12-11 ENCOUNTER — Ambulatory Visit (INDEPENDENT_AMBULATORY_CARE_PROVIDER_SITE_OTHER): Payer: Medicaid Other | Admitting: Dermatology

## 2019-12-11 ENCOUNTER — Other Ambulatory Visit: Payer: Self-pay

## 2019-12-11 DIAGNOSIS — L732 Hidradenitis suppurativa: Secondary | ICD-10-CM | POA: Diagnosis not present

## 2019-12-11 DIAGNOSIS — L98499 Non-pressure chronic ulcer of skin of other sites with unspecified severity: Secondary | ICD-10-CM

## 2019-12-11 NOTE — Progress Notes (Signed)
   Follow-Up Visit   Subjective  Maybelle Jantz is a 34 y.o. female who presents for the following: Wound Check ( recheck excision site (patient states that ulceration is still draining. She is still using the Mupirocin 2% ointment daily).  The following portions of the chart were reviewed this encounter and updated as appropriate:  Tobacco  Allergies  Meds  Problems  Med Hx  Surg Hx  Fam Hx     Review of Systems:  No other skin or systemic complaints except as noted in HPI or Assessment and Plan.  Objective  Well appearing patient in no apparent distress; mood and affect are within normal limits.  A focused examination was performed including back. Relevant physical exam findings are noted in the Assessment and Plan.    Assessment & Plan  Hidradenitis suppurativa with ulcer Mid Back At site of surgical excision (cyst) -  Continue Mupirocin 2% ointment to aa QD until healed. Will recheck ulceration in one week. Area cleansed with Puracyn spray, Mupirocin 2% ointment applied, covered with non-stick   Return in about 2 weeks (around 12/25/2019).  IMarye Round, CMA, am acting as scribe for Sarina Ser, MD .  Documentation: I have reviewed the above documentation for accuracy and completeness, and I agree with the above.  Sarina Ser, MD

## 2019-12-17 ENCOUNTER — Encounter: Payer: Self-pay | Admitting: Dermatology

## 2019-12-24 ENCOUNTER — Other Ambulatory Visit: Payer: Self-pay

## 2019-12-24 ENCOUNTER — Ambulatory Visit (INDEPENDENT_AMBULATORY_CARE_PROVIDER_SITE_OTHER): Payer: Medicaid Other | Admitting: Dermatology

## 2019-12-24 DIAGNOSIS — L732 Hidradenitis suppurativa: Secondary | ICD-10-CM | POA: Diagnosis not present

## 2019-12-24 NOTE — Progress Notes (Signed)
   Follow-Up Visit   Subjective  Leslie Berger is a 34 y.o. female who presents for the following: Wound Check (2 weeks f/u ulcer post previous surgery mid back superior spinal crease ).  The following portions of the chart were reviewed this encounter and updated as appropriate:  Tobacco  Allergies  Meds  Problems  Med Hx  Surg Hx  Fam Hx     Review of Systems:  No other skin or systemic complaints except as noted in HPI or Assessment and Plan.  Objective  Well appearing patient in no apparent distress; mood and affect are within normal limits.  A focused examination was performed including face, neck, chest and back and back . Relevant physical exam findings are noted in the Assessment and Plan.  Objective  Mid Back at superior spinal crease: Ulceration    Assessment & Plan  Hidradenitis suppurativa Mid Back at superior spinal crease  At site of surgical excision (cyst) - Much improvement noticed  Continue Mupirocin 2% ointment to aa QD until healed.  Area cleansed with Puracyn spray, Mupirocin 2% ointment applied, covered with non-stick gauze, gauze,  Return in about 4 weeks (around 01/21/2020) for HS .  IMarye Round, CMA, am acting as scribe for Sarina Ser, MD .  Documentation: I have reviewed the above documentation for accuracy and completeness, and I agree with the above.  Sarina Ser, MD

## 2019-12-29 ENCOUNTER — Other Ambulatory Visit: Payer: Self-pay

## 2019-12-29 ENCOUNTER — Ambulatory Visit (LOCAL_COMMUNITY_HEALTH_CENTER): Payer: Medicaid Other

## 2019-12-29 VITALS — BP 132/84 | Wt 278.0 lb

## 2019-12-29 DIAGNOSIS — Z3009 Encounter for other general counseling and advice on contraception: Secondary | ICD-10-CM | POA: Diagnosis not present

## 2019-12-29 DIAGNOSIS — Z3042 Encounter for surveillance of injectable contraceptive: Secondary | ICD-10-CM | POA: Diagnosis not present

## 2019-12-29 DIAGNOSIS — Z30013 Encounter for initial prescription of injectable contraceptive: Secondary | ICD-10-CM

## 2019-12-29 NOTE — Progress Notes (Signed)
Depo given in Right Glut per [place], [place] 06/2019 order. Aileen Fass, RN

## 2019-12-31 ENCOUNTER — Encounter: Payer: Self-pay | Admitting: Dermatology

## 2020-01-22 ENCOUNTER — Ambulatory Visit: Payer: Medicaid Other | Admitting: Dermatology

## 2020-02-20 ENCOUNTER — Ambulatory Visit (INDEPENDENT_AMBULATORY_CARE_PROVIDER_SITE_OTHER): Payer: Medicaid Other | Admitting: Obstetrics & Gynecology

## 2020-02-20 ENCOUNTER — Encounter: Payer: Self-pay | Admitting: Obstetrics & Gynecology

## 2020-02-20 ENCOUNTER — Telehealth: Payer: Self-pay | Admitting: Obstetrics & Gynecology

## 2020-02-20 ENCOUNTER — Other Ambulatory Visit: Payer: Self-pay

## 2020-02-20 VITALS — BP 130/90 | Ht 64.0 in | Wt 278.0 lb

## 2020-02-20 DIAGNOSIS — Z3009 Encounter for other general counseling and advice on contraception: Secondary | ICD-10-CM | POA: Diagnosis not present

## 2020-02-20 DIAGNOSIS — N920 Excessive and frequent menstruation with regular cycle: Secondary | ICD-10-CM

## 2020-02-20 MED ORDER — IBUPROFEN 800 MG PO TABS: ORAL_TABLET | ORAL | 0 refills | Status: DC

## 2020-02-20 MED ORDER — DIAZEPAM 5 MG PO TABS: 5.0000 mg | ORAL_TABLET | Freq: Once | ORAL | 0 refills | Status: AC

## 2020-02-20 NOTE — Progress Notes (Signed)
  Contraception Counseling Patient presents for contraception counseling. The patient has no complaints today. The patient is not currently sexually active. Pertinent past medical history: none.   She has been on Depo for years due to menorrhagia and dysmenorrhea.  Recent weight loss surgery, and now concerns for weight gain side effect of Depo.  No periods while on Depo.  Prefers no pills.   PMHx: She  has a past medical history of Acid reflux, Diabetes mellitus without complication ([place]), History of weight loss surgery (12/26/2017), and Hypertension. Also,  has a past surgical history that includes Cyst excision; laparotomy (N/A, 07/17/2015); Ovarian cyst removal (Bilateral, 07/17/2015); and Ovarian cyst removal., family history includes Diabetes in her mother; Hyperlipidemia in her maternal grandfather; Hypertension in her maternal grandfather, mother, and paternal grandmother.,  reports that she has never smoked. She has never used smokeless tobacco. She reports that she does not drink alcohol and does not use drugs.  She has a current medication list which includes the following prescription(s): tazorac, albuterol, azithromycin, cholecalciferol, diazepam, ibuprofen, multi-vitamin, multivitamin with minerals, omeprazole, omeprazole, prednisone, cln sportwash, and topiramate, and the following Facility-Administered Medications: medroxyprogesterone. Also, has No Known Allergies.  Review of Systems  Constitutional: Negative for chills, fever and malaise/fatigue.  HENT: Negative for congestion, sinus pain and sore throat.   Eyes: Negative for blurred vision and pain.  Respiratory: Negative for cough and wheezing.   Cardiovascular: Negative for chest pain and leg swelling.  Gastrointestinal: Negative for abdominal pain, constipation, diarrhea, heartburn, nausea and vomiting.  Genitourinary: Negative for dysuria, frequency, hematuria and urgency.  Musculoskeletal: Negative for back pain, joint pain,  myalgias and neck pain.  Skin: Negative for itching and rash.  Neurological: Negative for dizziness, tremors and weakness.  Endo/Heme/Allergies: Does not bruise/bleed easily.  Psychiatric/Behavioral: Negative for depression. The patient is not nervous/anxious and does not have insomnia.     Objective: BP 130/90   Ht 5\' 4"  (1.626 m)   Wt 278 lb (126.1 kg)   BMI 47.72 kg/m  Physical Exam Constitutional:      General: She is not in acute distress.    Appearance: She is well-developed.  Musculoskeletal:        General: Normal range of motion.  Neurological:     Mental Status: She is alert and oriented to person, place, and time.  Skin:    General: Skin is warm and dry.  Vitals reviewed.     ASSESSMENT/PLAN:   Problem List Items Addressed This Visit   Visit Diagnoses    Family planning    -  Primary   Menorrhagia with regular cycle        Change from Depo to alternative, prefers Mirena after full discussion today Plan visit w pre-medication to ensure comfort and success Wishes to avoid periods and risk for return of heavy cycles and pain  Birth Control I discussed multiple birth control options and methods with the patient.  The risks and benefits of each were reviewed.  The possible side effects including deep venous thrombosis, breast tenderness, fluid retention, mood changes and abnormal vaginal bleeding were discussed.  Combination as well as progesterone-only options, pros and cons counseled.  A total of 20 minutes were spent face-to-face with the patient as well as preparation, review, communication, and documentation during this encounter.    Barnett Applebaum, MD, [facility], [facility] 02/20/2020  10:28 AM

## 2020-02-20 NOTE — Patient Instructions (Signed)
Levonorgestrel intrauterine device (IUD) What is this medicine? LEVONORGESTREL IUD (LEE voe nor jes trel) is a contraceptive (birth control) device. The device is placed inside the uterus by a healthcare professional. It is used to prevent pregnancy. This device can also be used to treat heavy bleeding that occurs during your period. This medicine may be used for other purposes; ask your health care provider or pharmacist if you have questions. COMMON BRAND NAME(S): Kyleena, LILETTA, Mirena, Skyla What should I tell my health care provider before I take this medicine? They need to know if you have any of these conditions:  abnormal Pap smear  cancer of the breast, uterus, or cervix  diabetes  endometritis  genital or pelvic infection now or in the past  have more than one sexual partner or your partner has more than one partner  heart disease  history of an ectopic or tubal pregnancy  immune system problems  IUD in place  liver disease or tumor  problems with blood clots or take blood-thinners  seizures  use intravenous drugs  uterus of unusual shape  vaginal bleeding that has not been explained  an unusual or allergic reaction to levonorgestrel, other hormones, silicone, or polyethylene, medicines, foods, dyes, or preservatives  pregnant or trying to get pregnant  breast-feeding How should I use this medicine? This device is placed inside the uterus by a health care professional. Talk to your pediatrician regarding the use of this medicine in children. Special care may be needed. Overdosage: If you think you have taken too much of this medicine contact a poison control center or emergency room at once. NOTE: This medicine is only for you. Do not share this medicine with others. What if I miss a dose? This does not apply. Depending on the brand of device you have inserted, the device will need to be replaced every 3 to 6 years if you wish to continue using this type  of birth control. What may interact with this medicine? Do not take this medicine with any of the following medications:  amprenavir  bosentan  fosamprenavir This medicine may also interact with the following medications:  aprepitant  armodafinil  barbiturate medicines for inducing sleep or treating seizures  bexarotene  boceprevir  griseofulvin  medicines to treat seizures like carbamazepine, ethotoin, felbamate, oxcarbazepine, phenytoin, topiramate  modafinil  pioglitazone  rifabutin  rifampin  rifapentine  some medicines to treat HIV infection like atazanavir, efavirenz, indinavir, lopinavir, nelfinavir, tipranavir, ritonavir  St. John's wort  warfarin This list may not describe all possible interactions. Give your health care provider a list of all the medicines, herbs, non-prescription drugs, or dietary supplements you use. Also tell them if you smoke, drink alcohol, or use illegal drugs. Some items may interact with your medicine. What should I watch for while using this medicine? Visit your doctor or health care professional for regular check ups. See your doctor if you or your partner has sexual contact with others, becomes HIV positive, or gets a sexual transmitted disease. This product does not protect you against HIV infection (AIDS) or other sexually transmitted diseases. You can check the placement of the IUD yourself by reaching up to the top of your vagina with clean fingers to feel the threads. Do not pull on the threads. It is a good habit to check placement after each menstrual period. Call your doctor right away if you feel more of the IUD than just the threads or if you cannot feel the threads at   all. The IUD may come out by itself. You may become pregnant if the device comes out. If you notice that the IUD has come out use a backup birth control method like condoms and call your health care provider. Using tampons will not change the position of the  IUD and are okay to use during your period. This IUD can be safely scanned with magnetic resonance imaging (MRI) only under specific conditions. Before you have an MRI, tell your healthcare provider that you have an IUD in place, and which type of IUD you have in place. What side effects may I notice from receiving this medicine? Side effects that you should report to your doctor or health care professional as soon as possible:  allergic reactions like skin rash, itching or hives, swelling of the face, lips, or tongue  fever, flu-like symptoms  genital sores  high blood pressure  no menstrual period for 6 weeks during use  pain, swelling, warmth in the leg  pelvic pain or tenderness  severe or sudden headache  signs of pregnancy  stomach cramping  sudden shortness of breath  trouble with balance, talking, or walking  unusual vaginal bleeding, discharge  yellowing of the eyes or skin Side effects that usually do not require medical attention (report to your doctor or health care professional if they continue or are bothersome):  acne  breast pain  change in sex drive or performance  changes in weight  cramping, dizziness, or faintness while the device is being inserted  headache  irregular menstrual bleeding within first 3 to 6 months of use  nausea This list may not describe all possible side effects. Call your doctor for medical advice about side effects. You may report side effects to FDA at 1-800-FDA-1088. Where should I keep my medicine? This does not apply. NOTE: This sheet is a summary. It may not cover all possible information. If you have questions about this medicine, talk to your doctor, pharmacist, or health care provider.  2020 Elsevier/Gold Standard (2018-02-26 13:22:01)  

## 2020-02-20 NOTE — Telephone Encounter (Signed)
Patient is scheduled for mirena placement on 03/18/20 with RPH at 10:40

## 2020-03-18 ENCOUNTER — Encounter: Payer: Self-pay | Admitting: Obstetrics & Gynecology

## 2020-03-18 ENCOUNTER — Other Ambulatory Visit: Payer: Self-pay

## 2020-03-18 ENCOUNTER — Ambulatory Visit (INDEPENDENT_AMBULATORY_CARE_PROVIDER_SITE_OTHER): Payer: Medicaid Other | Admitting: Obstetrics & Gynecology

## 2020-03-18 VITALS — BP 122/80 | Ht 64.0 in | Wt 288.0 lb

## 2020-03-18 DIAGNOSIS — N920 Excessive and frequent menstruation with regular cycle: Secondary | ICD-10-CM

## 2020-03-18 DIAGNOSIS — Z3009 Encounter for other general counseling and advice on contraception: Secondary | ICD-10-CM | POA: Diagnosis not present

## 2020-03-18 NOTE — Patient Instructions (Signed)
Etonogestrel implant What is this medicine? ETONOGESTREL (et oh noe JES trel) is a contraceptive (birth control) device. It is used to prevent pregnancy. It can be used for up to 3 years. This medicine may be used for other purposes; ask your health care provider or pharmacist if you have questions. COMMON BRAND NAME(S): Implanon, Nexplanon What should I tell my health care provider before I take this medicine? They need to know if you have any of these conditions:  abnormal vaginal bleeding  blood vessel disease or blood clots  breast, cervical, endometrial, ovarian, liver, or uterine cancer  diabetes  gallbladder disease  heart disease or recent heart attack  high blood pressure  high cholesterol or triglycerides  kidney disease  liver disease  migraine headaches  seizures  stroke  tobacco smoker  an unusual or allergic reaction to etonogestrel, anesthetics or antiseptics, other medicines, foods, dyes, or preservatives  pregnant or trying to get pregnant  breast-feeding How should I use this medicine? This device is inserted just under the skin on the inner side of your upper arm by a health care professional. Talk to your pediatrician regarding the use of this medicine in children. Special care may be needed. Overdosage: If you think you have taken too much of this medicine contact a poison control center or emergency room at once. NOTE: This medicine is only for you. Do not share this medicine with others. What if I miss a dose? This does not apply. What may interact with this medicine? Do not take this medicine with any of the following medications:  amprenavir  fosamprenavir This medicine may also interact with the following medications:  acitretin  aprepitant  armodafinil  bexarotene  bosentan  carbamazepine  certain medicines for fungal infections like fluconazole, ketoconazole, itraconazole and voriconazole  certain medicines to treat  hepatitis, HIV or AIDS  cyclosporine  felbamate  griseofulvin  lamotrigine  modafinil  oxcarbazepine  phenobarbital  phenytoin  primidone  rifabutin  rifampin  rifapentine  St. John's wort  topiramate This list may not describe all possible interactions. Give your health care provider a list of all the medicines, herbs, non-prescription drugs, or dietary supplements you use. Also tell them if you smoke, drink alcohol, or use illegal drugs. Some items may interact with your medicine. What should I watch for while using this medicine? This product does not protect you against HIV infection (AIDS) or other sexually transmitted diseases. You should be able to feel the implant by pressing your fingertips over the skin where it was inserted. Contact your doctor if you cannot feel the implant, and use a non-hormonal birth control method (such as condoms) until your doctor confirms that the implant is in place. Contact your doctor if you think that the implant may have broken or become bent while in your arm. You will receive a user card from your health care provider after the implant is inserted. The card is a record of the location of the implant in your upper arm and when it should be removed. Keep this card with your health records. What side effects may I notice from receiving this medicine? Side effects that you should report to your doctor or health care professional as soon as possible:  allergic reactions like skin rash, itching or hives, swelling of the face, lips, or tongue  breast lumps, breast tissue changes, or discharge  breathing problems  changes in emotions or moods  coughing up blood  if you feel that the implant   may have broken or bent while in your arm  high blood pressure  pain, irritation, swelling, or bruising at the insertion site  scar at site of insertion  signs of infection at the insertion site such as fever, and skin redness, pain or  discharge  signs and symptoms of a blood clot such as breathing problems; changes in vision; chest pain; severe, sudden headache; pain, swelling, warmth in the leg; trouble speaking; sudden numbness or weakness of the face, arm or leg  signs and symptoms of liver injury like dark yellow or brown urine; general ill feeling or flu-like symptoms; light-colored stools; loss of appetite; nausea; right upper belly pain; unusually weak or tired; yellowing of the eyes or skin  unusual vaginal bleeding, discharge Side effects that usually do not require medical attention (report to your doctor or health care professional if they continue or are bothersome):  acne  breast pain or tenderness  headache  irregular menstrual bleeding  nausea This list may not describe all possible side effects. Call your doctor for medical advice about side effects. You may report side effects to FDA at 1-800-FDA-1088. Where should I keep my medicine? This drug is given in a hospital or clinic and will not be stored at home. NOTE: This sheet is a summary. It may not cover all possible information. If you have questions about this medicine, talk to your doctor, pharmacist, or health care provider.  2020 Elsevier/Gold Standard (2019-01-28 11:33:04)

## 2020-03-18 NOTE — Progress Notes (Signed)
  IUD PROCEDURE NOTE:  Jamal Haskin is a 34 y.o. G0P0000 here for IUD insertion. No GYN concerns.  Last pap smear was normal.  IUD Insertion Procedure Note Patient identified, informed consent performed, consent signed.   Discussed risks of irregular bleeding, cramping, infection, malpositioning or misplacement of the IUD outside the uterus which may require further procedure such as laparoscopy, risk of failure <1%. Time out was performed.  Urine pregnancy test negative.  A bimanual exam showed the uterus to be midposition.  Speculum placed in the vagina.  Cervix visualized.  Cleaned with Betadine x 2.  Grasped anteriorly with a single tooth tenaculum.  Uterus sounded to 8 cm.   Mirena IUD subsequently unable to be placed per manufacturer's recommendations due to cervical stenosis and position/angle deep at apex of vagina, and pt discomfort with this.  Patient tolerated procedure poorly, so this is abandoned.  After further discussion, she would prefer Nexplanon.  Risks based on weight discussed. She has HTN as well, and does not want to take or risk side effects of OCP, Nuvaring.   Nexplanon Insertion  Patient given informed consent, signed copy in the chart, time out was performed. Appropriate time out taken.  Patient's LEFT arm was prepped and draped in the usual sterile fashion.. The ruler used to measure and mark insertion area.  Pt was prepped with betadine swab and then injected with 3 cc of 2% lidocaine with epinephrine. Nexplanon removed form packaging,  Device confirmed in needle, then inserted full length of needle and withdrawn per handbook instructions.  Pt insertion site covered with steri-strip and a bandage.   Minimal blood loss.  Pt tolerated the procedure well.   Barnett Applebaum, MD, [facility], [facility] 03/18/2020  11:45 AM

## 2020-03-19 ENCOUNTER — Telehealth: Payer: Self-pay | Admitting: Obstetrics & Gynecology

## 2020-03-19 ENCOUNTER — Ambulatory Visit: Payer: Medicaid Other | Admitting: Obstetrics & Gynecology

## 2020-04-13 ENCOUNTER — Ambulatory Visit (INDEPENDENT_AMBULATORY_CARE_PROVIDER_SITE_OTHER): Payer: Medicaid Other | Admitting: Obstetrics & Gynecology

## 2020-04-13 ENCOUNTER — Encounter: Payer: Self-pay | Admitting: Obstetrics & Gynecology

## 2020-04-13 ENCOUNTER — Other Ambulatory Visit: Payer: Self-pay

## 2020-04-13 DIAGNOSIS — Z3046 Encounter for surveillance of implantable subdermal contraceptive: Secondary | ICD-10-CM

## 2020-04-13 NOTE — Progress Notes (Signed)
Virtual Visit via Telephone Note  I connected with Leslie Berger on 04/13/20 at  3:50 PM EST by telephone and verified that I am speaking with the correct person using two identifiers.  Location: Patient: Home Provider: Office   I discussed the limitations, risks, security and privacy concerns of performing an evaluation and management service by telephone and the availability of in person appointments. I also discussed with the patient that there may be a patient responsible charge related to this service. The patient expressed understanding and agreed to proceed.   History of Present Illness: Leslie Berger is a 34 y.o. that had a Nexplanon subdermal implant placed approximately 4 weeks ago. Since that time, she states that she has not had irregular bleeding, and has not had pain in her arm or fingers. Incisional concerns: No.  PMHx: She  has a past medical history of Acid reflux, Diabetes mellitus without complication ([place]), History of weight loss surgery (12/26/2017), and Hypertension. Also,  has a past surgical history that includes Cyst excision; laparotomy (N/A, 07/17/2015); Ovarian cyst removal (Bilateral, 07/17/2015); and Ovarian cyst removal., family history includes Diabetes in her mother; Hyperlipidemia in her maternal grandfather; Hypertension in her maternal grandfather, mother, and paternal grandmother.,  reports that she has never smoked. She has never used smokeless tobacco. She reports that she does not drink alcohol and does not use drugs.  She has a current medication list which includes the following prescription(s): albuterol, multi-vitamin, multivitamin with minerals, topiramate, azithromycin, cholecalciferol, ibuprofen, omeprazole, omeprazole, prednisone, cln sportwash, and tazorac, and the following Facility-Administered Medications: medroxyprogesterone. Also, has No Known Allergies.  Review of Systems  All other systems reviewed and are negative.     Observations/Objective: No exam today, due to telephone eVisit due to [facility] virus restriction on elective visits and procedures.  Prior visits reviewed along with ultrasounds/labs as indicated.  Assessment and Plan:   ICD-10-CM   1. Encounter for surveillance of Nexplanon subdermal contraceptive  Z30.46    Plan: She was told to continue to use barrier contraception, in order to prevent any STIs, and to take a home pregnancy test or call us if she ever thinks she may be pregnant, and that her Nexplanon expires in 3 years.  She was amenable to this plan and we will see her back in 1 year/PRN.   I discussed the assessment and treatment plan with the patient. The patient was provided an opportunity to ask questions and all were answered. The patient agreed with the plan and demonstrated an understanding of the instructions.   The patient was advised to call back or seek an in-person evaluation if the symptoms worsen or if the condition fails to improve as anticipated.  I provided 14 minutes of non-face-to-face time during this encounter.   Hoyt Koch, MD

## 2020-04-20 ENCOUNTER — Ambulatory Visit: Payer: Medicaid Other | Admitting: Dermatology

## 2020-04-20 ENCOUNTER — Other Ambulatory Visit: Payer: Self-pay

## 2020-04-20 DIAGNOSIS — L732 Hidradenitis suppurativa: Secondary | ICD-10-CM

## 2020-04-20 DIAGNOSIS — L700 Acne vulgaris: Secondary | ICD-10-CM | POA: Diagnosis not present

## 2020-04-20 MED ORDER — TAZORAC 0.05 % EX CREA: TOPICAL_CREAM | Freq: Every day | CUTANEOUS | 0 refills | Status: DC

## 2020-04-20 NOTE — Patient Instructions (Signed)
Puracyn spray on amazone  Or   CLN bodywash

## 2020-04-20 NOTE — Progress Notes (Signed)
   Follow-Up Visit   Subjective  Leslie Berger is a 34 y.o. female who presents for the following: HS  (Mid back spinal crease - still itching and draining. Patient is out of Mupirocin 2% ointment. ) and Acne (Face, pt not using anything now, Tazorac 0.05% cr in past).  The following portions of the chart were reviewed this encounter and updated as appropriate:   Tobacco  Allergies  Meds  Problems  Med Hx  Surg Hx  Fam Hx     Review of Systems:  No other skin or systemic complaints except as noted in HPI or Assessment and Plan.  Objective  Well appearing patient in no apparent distress; mood and affect are within normal limits.  A focused examination was performed including the back . Relevant physical exam findings are noted in the Assessment and Plan.  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 It can be associated with severe scarring acne and cysts; abscesses and scarring of scalp. The goal is control and prevention of flares, as it is not curable. Scars are permanent and can be thickened. Treatment may include daily use of topical medication and oral antibiotics.  Oral isotretinoin may also be helpful.  For more severe cases, Humira (a biologic injection) may be prescribed to decrease the inflammatory process and prevent flares.  When indicated, inflamed cysts may also be treated surgically.   Start Puracyn or CLN wash qd  Wound cleansed today with Puracyn, mupirocin oint applied and bandage  Acne vulgaris face Chronic and persistent.  Not to goal Restart Tazorac 0.05% cr qhs  tazarotene (TAZORAC) 0.05 % cream -  face  Return in about 3 months (around 07/19/2020).  I, Othelia Pulling, RMA, am acting as scribe for Sarina Ser, MD .  Documentation: I have reviewed the above documentation for accuracy and completeness, and I agree with the above.  Sarina Ser, MD

## 2020-04-28 ENCOUNTER — Encounter: Payer: Self-pay | Admitting: Dermatology

## 2020-05-21 ENCOUNTER — Other Ambulatory Visit: Payer: Medicaid Other

## 2020-05-24 ENCOUNTER — Other Ambulatory Visit: Payer: Medicaid Other

## 2020-07-21 ENCOUNTER — Ambulatory Visit (INDEPENDENT_AMBULATORY_CARE_PROVIDER_SITE_OTHER): Payer: Medicaid Other | Admitting: Dermatology

## 2020-07-21 ENCOUNTER — Other Ambulatory Visit: Payer: Self-pay

## 2020-07-21 DIAGNOSIS — L732 Hidradenitis suppurativa: Secondary | ICD-10-CM | POA: Diagnosis not present

## 2020-07-21 DIAGNOSIS — L700 Acne vulgaris: Secondary | ICD-10-CM | POA: Diagnosis not present

## 2020-07-21 MED ORDER — TAZORAC 0.05 % EX CREA: TOPICAL_CREAM | Freq: Every day | CUTANEOUS | 6 refills | Status: DC

## 2020-07-21 MED ORDER — CLINDAMYCIN PHOSPHATE 1 % EX SOLN: Freq: Every day | CUTANEOUS | 6 refills | Status: DC

## 2020-07-21 NOTE — Patient Instructions (Addendum)

## 2020-07-21 NOTE — Progress Notes (Signed)
   Follow-Up Visit   Subjective  Leslie Berger is a 35 y.o. female who presents for the following: Acne (Follow up - needs refill of Tazorac) and Follow-up (Hidradenitis follow up - back. Has not had any recent spots but does have some itching ). Also complains of acne.  The following portions of the chart were reviewed this encounter and updated as appropriate:   Tobacco  Allergies  Meds  Problems  Med Hx  Surg Hx  Fam Hx     Review of Systems:  No other skin or systemic complaints except as noted in HPI or Assessment and Plan.  Objective  Well appearing patient in no apparent distress; mood and affect are within normal limits.  A focused examination was performed including face, back. Relevant physical exam findings are noted in the Assessment and Plan.  Objective  Mid Back: 1 draining area of mid back Comedones of face with spotty hyperpigmentation.  Images       Assessment & Plan  Hidradenitis suppurativa Mid Back  Hidradenitis Suppurativa is a chronic; persistent; non-curable, but treatable condition due to abnormal inflamed sweat glands in the body folds (axilla, inframammary, groin, medial thighs), causing recurrent painful cysts and scarring. It can be associated with severe scarring acne and cysts; abscesses and scarring of scalp. The goal is control and prevention of flares, as it is not curable. Scars are permanent and can be thickened. Treatment may include daily use of topical medication and oral antibiotics.  Oral isotretinoin may also be helpful.  For more severe cases, Humira (a biologic injection) may be prescribed to decrease the inflammatory process and prevent flares.  When indicated, inflamed cysts may also be treated surgically.  Continue to wash with antibacterial soap.  Start Clindamycin solution qd May consider Isotretinoin or Humira in future, but at this point it is fairly localized. Her weight loss seems to have helped  condition.  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 Restart Tazorac 0.05% cream qhs tazarotene (TAZORAC) 0.05 % cream - Face  Return in about 6 months (around 01/21/2021) for Follow up.  I, Ashok Cordia, CMA, am acting as scribe for Sarina Ser, MD .  Documentation: I have reviewed the above documentation for accuracy and completeness, and I agree with the above.  Sarina Ser, MD

## 2020-07-27 ENCOUNTER — Encounter: Payer: Self-pay | Admitting: Dermatology

## 2020-09-02 ENCOUNTER — Telehealth: Payer: Self-pay

## 2020-09-02 MED ORDER — EPIDUO FORTE 0.3-2.5 % EX GEL: 1.0000 "application " | Freq: Every evening | CUTANEOUS | 5 refills | Status: DC

## 2020-09-02 NOTE — Telephone Encounter (Signed)
RX sent in and patient advised of medication change.

## 2020-09-02 NOTE — Telephone Encounter (Signed)
Tazorac is not on Medicaid's formulary. Please advised of alternative for patient to use for acne.  Preferred Options: -Clindamycin/BP Gel -Clindamycin Solution -Differin -Epiduo Gel -Epiduo Forte -Erythromycin Solution -Erythromycin/BP Gel -Retin-A Cream/Gel -Retin-A Micro Gel

## 2020-09-02 NOTE — Telephone Encounter (Signed)
Send EpiDuo Forte with 6 rf

## 2020-10-10 IMAGING — CT CT ANGIO CHEST
2 of 6 series · 18 of 46 positions shown · IV contrast (APPLIED)
Comparison: 06/16/2019

CLINICAL DATA: Shortness of breath, cough, elevated D-dimer, recent
TXE7H-M5 diagnosis 06/06/2019

EXAM:
CT ANGIOGRAPHY CHEST WITH CONTRAST
TECHNIQUE: Multidetector CT imaging of the chest was performed using the
standard protocol during bolus administration of intravenous
contrast. Multiplanar CT image reconstructions and MIPs were
obtained to evaluate the vascular anatomy.
CONTRAST:  75mL OMNIPAQUE IOHEXOL 350 MG/ML SOLN

[Series 5: thins · axial · 0.61mm/px · z∈[-266,-59]mm · 16 of 227 slices shown]
[im 10/227  lung]
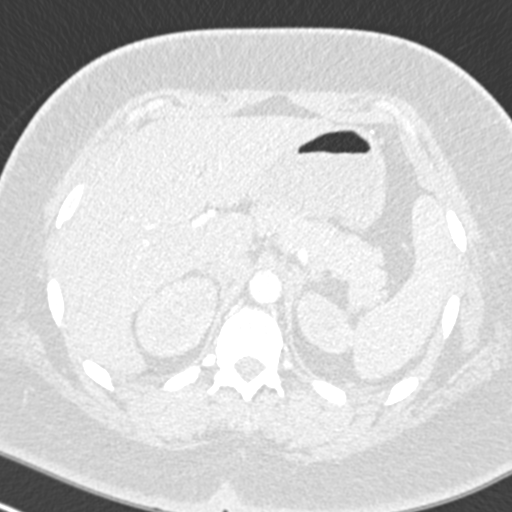
[im 30/227  soft-tissue]
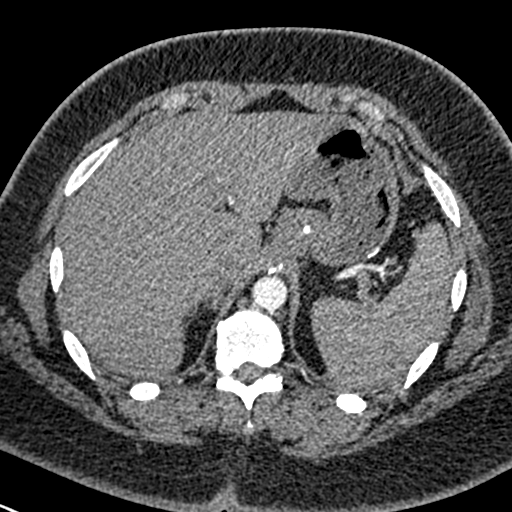
[im 40/227  lung]
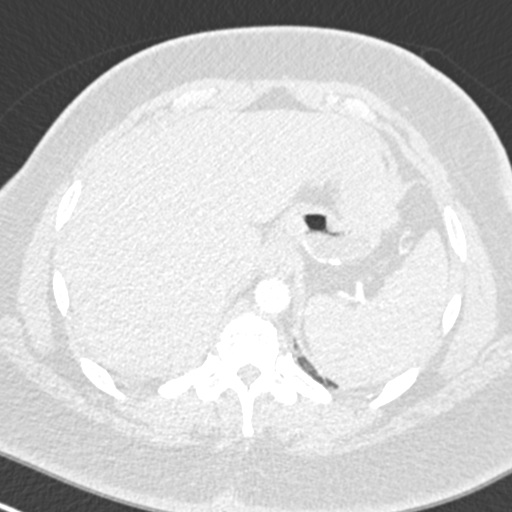
[im 50/227  soft-tissue]
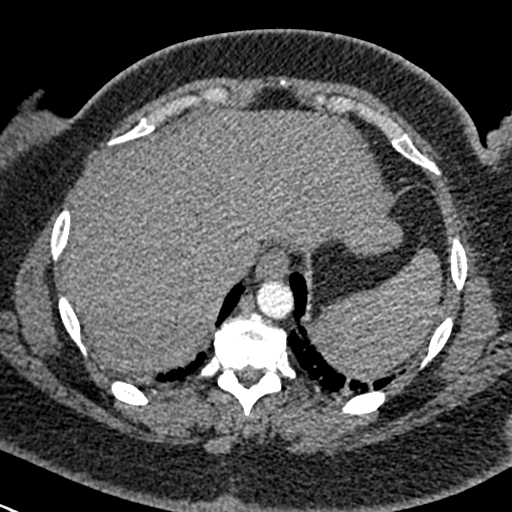
[im 69/227  lung]
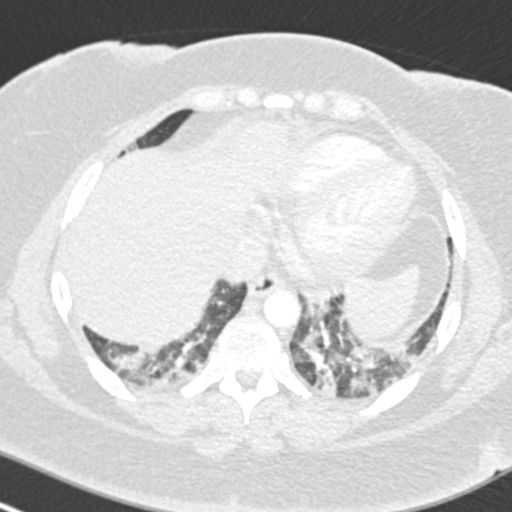
[im 79/227  soft-tissue]
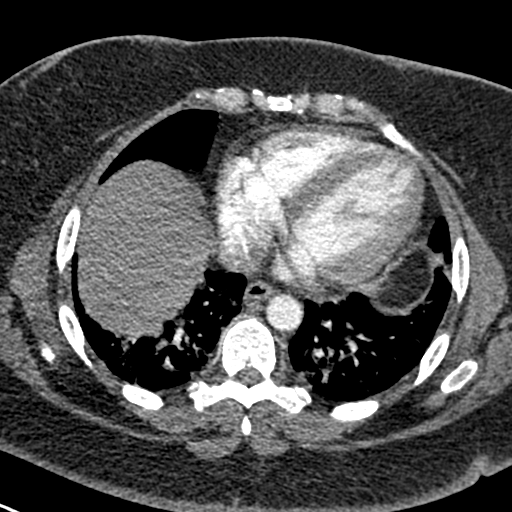
[im 89/227  lung]
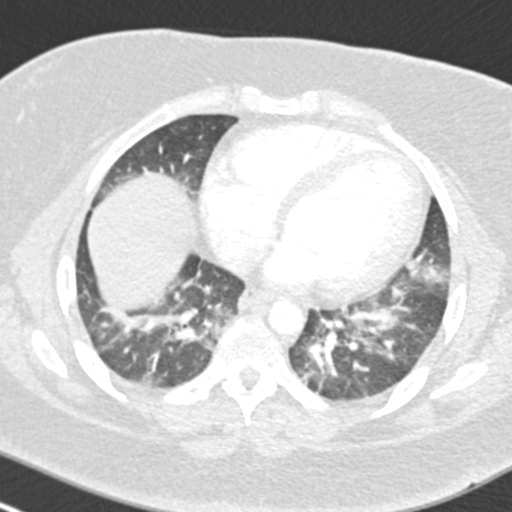
[im 109/227  soft-tissue]
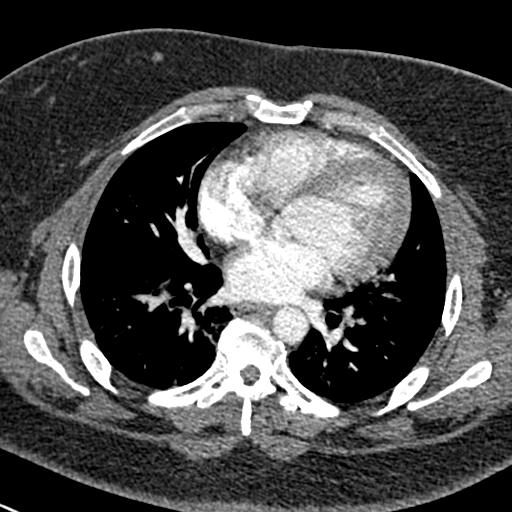
[im 118/227  lung]
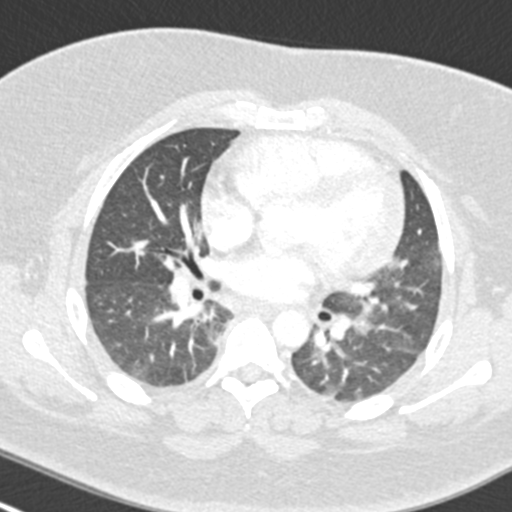
[im 138/227  soft-tissue]
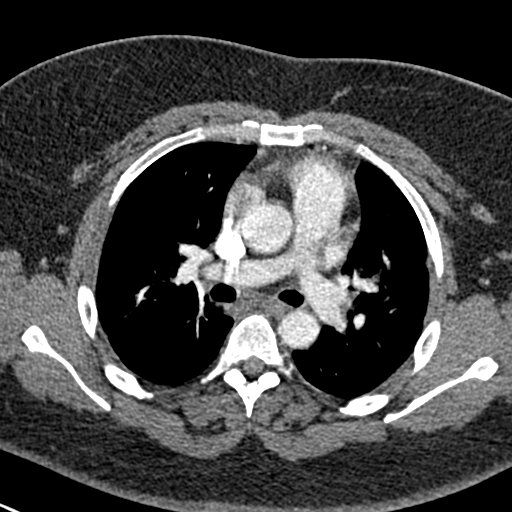
[im 148/227  lung]
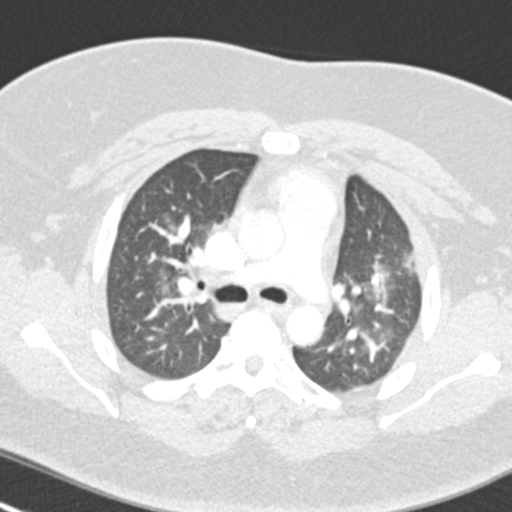
[im 158/227  soft-tissue]
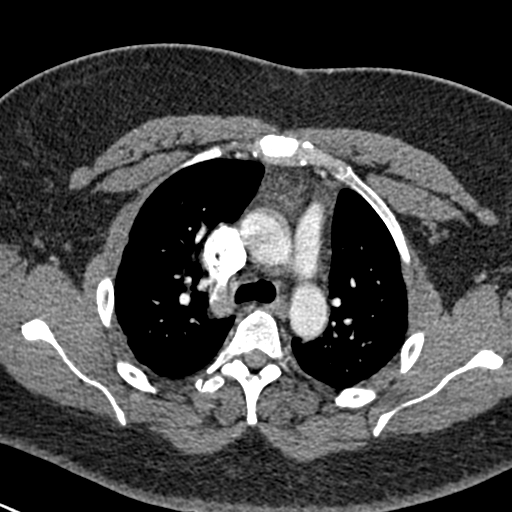
[im 177/227  lung]
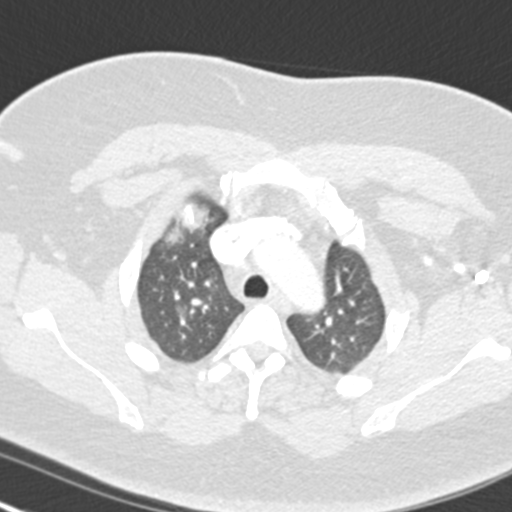
[im 187/227  soft-tissue]
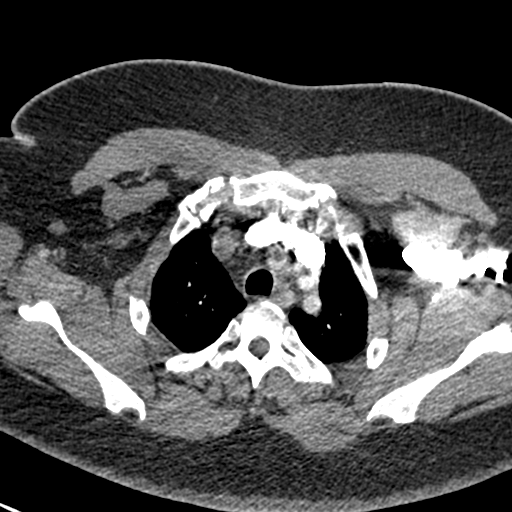
[im 197/227  lung]
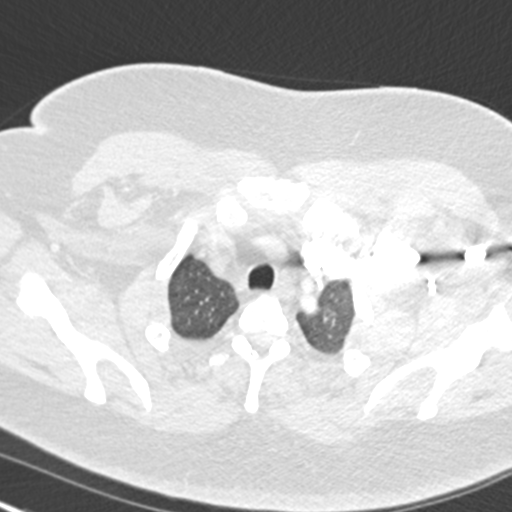
[im 217/227  soft-tissue]
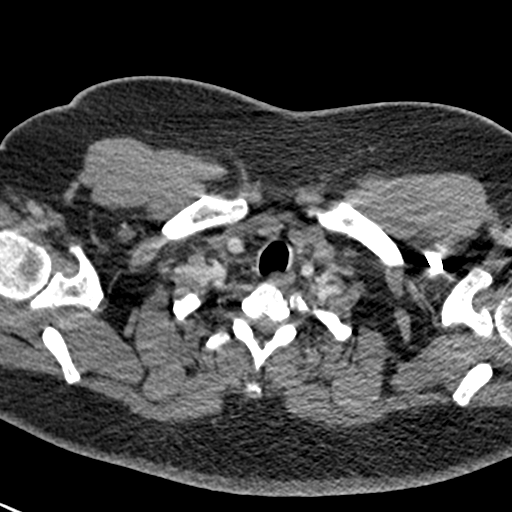

[Series 7: coronal mpr · coronal · 0.46mm/px · 2 of 85 slices shown]
[im 29/85  soft-tissue]
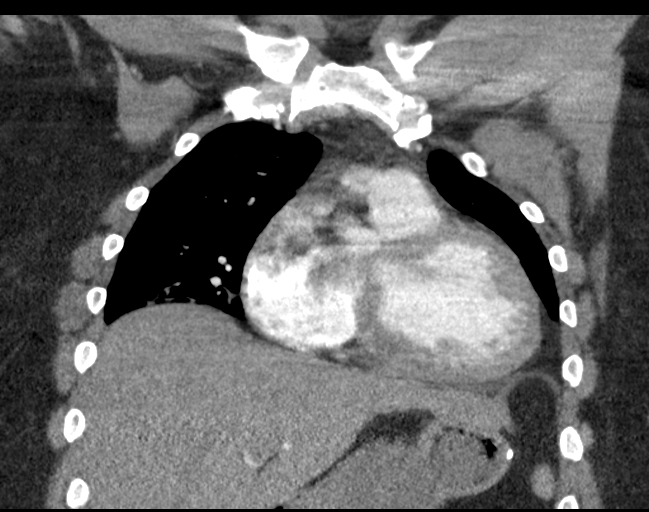
[im 57/85  soft-tissue]
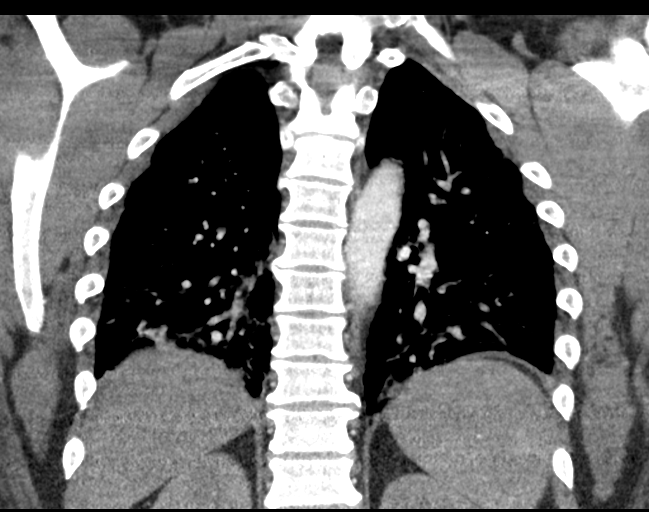

[18 of 46 positions shown; findings below may reference images not displayed]

FINDINGS: Cardiovascular: This is a technically adequate evaluation of the
pulmonary vasculature. No filling defects or pulmonary emboli.

The heart is unremarkable without pericardial effusion. Thoracic
aorta is normal without aneurysm or dissection.

Mediastinum/Nodes: No enlarged mediastinal, hilar, or axillary lymph
nodes. Thyroid gland, trachea, and esophagus demonstrate no
significant findings.

Lungs/Pleura: There are scattered ground-glass opacities seen
bilaterally consistent with multifocal TXE7H-M5 pneumonia. No
effusion or pneumothorax. The central airways are patent.

Upper Abdomen: Postsurgical changes are seen from bariatric surgery.
Otherwise no acute findings.

Musculoskeletal: No acute or destructive bony lesions. Reconstructed
images demonstrate no additional findings.

Review of the MIP images confirms the above findings.
IMPRESSION: 1. Multifocal ground-glass airspace disease bilaterally, consistent
with TXE7H-M5 pneumonia.
2. No evidence of pulmonary embolus.

## 2021-01-13 ENCOUNTER — Other Ambulatory Visit: Payer: Self-pay

## 2021-01-13 ENCOUNTER — Ambulatory Visit (INDEPENDENT_AMBULATORY_CARE_PROVIDER_SITE_OTHER): Payer: Medicaid Other | Admitting: Dermatology

## 2021-01-13 DIAGNOSIS — L732 Hidradenitis suppurativa: Secondary | ICD-10-CM

## 2021-01-13 DIAGNOSIS — L700 Acne vulgaris: Secondary | ICD-10-CM | POA: Diagnosis not present

## 2021-01-13 MED ORDER — ACZONE 7.5 % EX GEL: 1.0000 "application " | Freq: Every morning | CUTANEOUS | 6 refills | Status: DC

## 2021-01-13 MED ORDER — CLINDAMYCIN PHOSPHATE 1 % EX SOLN: Freq: Every day | CUTANEOUS | 6 refills | Status: DC

## 2021-01-13 MED ORDER — DIFFERIN 0.3 % EX GEL: 1.0000 "application " | Freq: Every day | CUTANEOUS | 6 refills | Status: DC

## 2021-01-13 NOTE — Progress Notes (Signed)
   Follow-Up Visit   Subjective  Leslie Berger is a 35 y.o. female who presents for the following: Hidradenitis suppurativa (Mid back, 87mf/u, Clindamycin solution qd, washing with antibacterial soap) and Acne (Face, Tazorac cr not covered this time but has worked in past, so tried [employer] it burned her face, not using anything now).  The following portions of the chart were reviewed this encounter and updated as appropriate:   Tobacco  Allergies  Meds  Problems  Med Hx  Surg Hx  Fam Hx     Review of Systems:  No other skin or systemic complaints except as noted in HPI or Assessment and Plan.  Objective  Well appearing patient in no apparent distress; mood and affect are within normal limits.  A focused examination was performed including face, back. Relevant physical exam findings are noted in the Assessment and Plan.  face Moderate open comedones cheeks  Mid Back No openings or drainage   Assessment & Plan  Acne vulgaris face  Chronic, persistent  Start Adapalene 0.3% gel qhs as tolerated to face for acne Start Aczone 7.5% gel qam  Topical retinoid medications like tretinoin/Retin-A, adapalene/Differin, tazarotene/Fabior, and Epiduo/Epiduo Forte can cause dryness and irritation when first started. Only apply a pea-sized amount to the entire affected area. Avoid applying it around the eyes, edges of mouth and creases at the nose. If you experience irritation, use a good moisturizer first and/or apply the medicine less often. If you are doing well with the medicine, you can increase how often you use it until you are applying every night. Be careful with sun protection while using this medication as it can make you sensitive to the sun. This medicine should not be used by pregnant women.    DIFFERIN 0.3 % gel - face Apply 1 application topically at bedtime. Qhs to face for acne  ACZONE 7.5 % GEL - face Apply 1 application topically every morning. Qam to face  for acne  Related Medications tazarotene (TAZORAC) 0.05 % cream Apply topically at bedtime. qhs to face for acne  Hidradenitis suppurativa Mid Back Controlled today  Hidradenitis Suppurativa is a chronic; persistent; non-curable, but treatable condition due to abnormal inflamed sweat glands in the body folds (axilla, inframammary, groin, medial thighs), causing recurrent painful draining cysts and scarring. It can be associated with severe scarring acne and cysts; also abscesses and scarring of scalp. The goal is control and prevention of flares, as it is not curable. Scars are permanent and can be thickened. Treatment may include daily use of topical medication and oral antibiotics.  Oral isotretinoin may also be helpful.  For more severe cases, Humira (a biologic injection) may be prescribed to decrease the inflammatory process and prevent flares.  When indicated, inflamed cysts may also be treated surgically.  Cont Clindamycin solution qd Cont antibacterial soap  Related Medications clindamycin (CLEOCIN-T) 1 % external solution Apply topically daily. Qd to aa back  Return in about 4 months (around 05/15/2021) for Acne f/u, HS f/u.  I, Sonya Hupman, RMA, am acting as scribe for DSarina Ser MD . Documentation: I have reviewed the above documentation for accuracy and completeness, and I agree with the above.  DSarina Ser MD

## 2021-01-13 NOTE — Patient Instructions (Signed)

## 2021-01-15 ENCOUNTER — Encounter: Payer: Self-pay | Admitting: Dermatology

## 2021-02-19 ENCOUNTER — Other Ambulatory Visit: Payer: Self-pay

## 2021-02-19 ENCOUNTER — Emergency Department
Admission: EM | Admit: 2021-02-19 | Discharge: 2021-02-19 | Disposition: A | Discharge status: Home / Self Care | Payer: Medicaid Other | Attending: Emergency Medicine | Admitting: Emergency Medicine

## 2021-02-19 ENCOUNTER — Encounter: Payer: Self-pay | Admitting: Emergency Medicine

## 2021-02-19 DIAGNOSIS — Z20822 Contact with and (suspected) exposure to covid-19: Secondary | ICD-10-CM | POA: Diagnosis not present

## 2021-02-19 DIAGNOSIS — E109 Type 1 diabetes mellitus without complications: Secondary | ICD-10-CM | POA: Insufficient documentation

## 2021-02-19 DIAGNOSIS — Z7951 Long term (current) use of inhaled steroids: Secondary | ICD-10-CM | POA: Diagnosis not present

## 2021-02-19 DIAGNOSIS — J101 Influenza due to other identified influenza virus with other respiratory manifestations: Secondary | ICD-10-CM | POA: Insufficient documentation

## 2021-02-19 DIAGNOSIS — R509 Fever, unspecified: Secondary | ICD-10-CM | POA: Diagnosis present

## 2021-02-19 DIAGNOSIS — J069 Acute upper respiratory infection, unspecified: Secondary | ICD-10-CM | POA: Diagnosis not present

## 2021-02-19 DIAGNOSIS — I10 Essential (primary) hypertension: Secondary | ICD-10-CM | POA: Insufficient documentation

## 2021-02-19 LAB — RESP PANEL BY RT-PCR (FLU A&B, COVID) ARPGX2
Influenza A by PCR: POSITIVE — AB
Influenza B by PCR: NEGATIVE
SARS Coronavirus 2 by RT PCR: NEGATIVE

## 2021-02-19 LAB — GROUP A STREP BY PCR: Group A Strep by PCR: NOT DETECTED

## 2021-02-19 MED ORDER — IPRATROPIUM-ALBUTEROL 0.5-2.5 (3) MG/3ML IN SOLN
3.0000 mL | Freq: Once | RESPIRATORY_TRACT | Status: AC
  Administered 2021-02-19: 3 mL via RESPIRATORY_TRACT
  Filled 2021-02-19: qty 3

## 2021-02-19 MED ORDER — ALBUTEROL SULFATE HFA 108 (90 BASE) MCG/ACT IN AERS: 1.0000 | INHALATION_SPRAY | Freq: Four times a day (QID) | RESPIRATORY_TRACT | 0 refills | Status: AC | PRN

## 2021-02-19 NOTE — ED Provider Notes (Signed)
[facility] Emergency Department Provider Note  ____________________________________________   Event Date/Time   First MD Initiated Contact with Patient 02/19/21 [phone]     (approximate)  I have reviewed the triage vital signs and the nursing notes.   HISTORY  Chief Complaint URI and Fever    HPI Leslie Berger is a 35 y.o. female presents emergency department complaining of URI symptoms.  Patient's had a sore throat, fever and body aches since Thursday.  States she got her flu shot last week.  States she has never had a flu shot before but it is mandatory so she got 1.  Started feeling this way after having the flu shot vaccine.  No chest pain or shortness of breath.  Patient does have history of asthma feels like she needs a breathing treatment.  No vomiting or diarrhea- is also concerned because her blood pressure is elevated  Past Medical History:  Diagnosis Date   Acid reflux    Diabetes mellitus without complication ([place])    History of weight loss surgery 12/26/2017   Hypertension     Patient Active Problem List   Diagnosis Date Noted   History of weight loss surgery 12/26/2017   Class 3 obesity with serious comorbidity and body mass index (BMI) of 50.0 to 59.9 in adult 08/07/2016   Dermoid cyst of both ovaries 07/17/2015   Hypertension 06/05/2014   Diabetes mellitus type I ([place]) 06/05/2014    Past Surgical History:  Procedure Laterality Date   CYST EXCISION     LAPAROTOMY N/A 07/17/2015   Procedure: LAPAROTOMY;  Surgeon: Gae Dry, MD;  Location: [facility] ORS;  Service: Gynecology;  Laterality: N/A;   OVARIAN CYST REMOVAL Bilateral 07/17/2015   Procedure: OVARIAN CYSTECTOMY;  Surgeon: Gae Dry, MD;  Location: [facility] ORS;  Service: Gynecology;  Laterality: Bilateral;   OVARIAN CYST REMOVAL      Prior to Admission medications   Medication Sig Start Date End Date Taking? Authorizing Provider  ACZONE 7.5 % GEL Apply 1 application  topically every morning. Qam to face for acne 01/13/21   Ralene Bathe, MD  albuterol (VENTOLIN HFA) 108 (90 Base) MCG/ACT inhaler Inhale 1-2 puffs into the lungs every 6 (six) hours as needed for wheezing or shortness of breath. 02/19/21   Stacie Knutzen, Linden Dolin, PA-C  clindamycin (CLEOCIN-T) 1 % external solution Apply topically daily. Qd to aa back 01/13/21   Ralene Bathe, MD  DIFFERIN 0.3 % gel Apply 1 application topically at bedtime. Qhs to face for acne 01/13/21   Ralene Bathe, MD  EPIDUO FORTE 0.3-2.5 % GEL Apply 1 application topically at bedtime. 09/02/20   Ralene Bathe, MD  tazarotene (TAZORAC) 0.05 % cream Apply topically at bedtime. qhs to face for acne 07/21/20   Ralene Bathe, MD  topiramate (TOPAMAX) 25 MG tablet Take by mouth.  01/22/20   [provider]    Allergies Patient has no known allergies.  Family History  Problem Relation Age of Onset   Hyperlipidemia Maternal Grandfather    Hypertension Maternal Grandfather    Hypertension Paternal Grandmother    Hypertension Mother    Diabetes Mother     Social History Social History   Tobacco Use   Smoking status: Never   Smokeless tobacco: Never  Vaping Use   Vaping Use: Never used  Substance Use Topics   Alcohol use: No   Drug use: No    Review of Systems  Constitutional: Positive fever/chills  Eyes: No visual changes. ENT: Positive sore throat. Respiratory: Positive cough and chest tightness like asthma Cardiovascular: Denies chest pain Gastrointestinal: Denies abdominal pain Genitourinary: Negative for dysuria. Musculoskeletal: Negative for back pain. Skin: Negative for rash. Psychiatric: no mood changes,     ____________________________________________   PHYSICAL EXAM:  VITAL SIGNS: ED Triage Vitals [02/19/21 0658]  Enc Vitals Group     BP (!) 143/101     Pulse Rate (!) 127     Resp 16     Temp 98.9 F (37.2 C)     Temp Source Oral     SpO2 100 %     Weight 270 lb  (122.5 kg)     Height 5\' 4"  (1.626 m)     Head Circumference      Peak Flow      Pain Score 6     Pain Loc      Pain Edu?      Excl. in [place]?     Constitutional: Alert and oriented. Well appearing and in no acute distress. Eyes: Conjunctivae are normal.  Head: Atraumatic. Nose: No congestion/rhinnorhea. Mouth/Throat: Mucous membranes are moist.   Neck:  supple no lymphadenopathy noted Cardiovascular: Normal rate, regular rhythm. Heart sounds are normal Respiratory: Normal respiratory effort.  No retractions, lungs c t a  GU: deferred Musculoskeletal: FROM all extremities, warm and well perfused Neurologic:  Normal speech and language.  Skin:  Skin is warm, dry and intact. No rash noted. Psychiatric: Mood and affect are normal. Speech and behavior are normal.  ____________________________________________   LABS (all labs ordered are listed, but only abnormal results are displayed)  Labs Reviewed  RESP PANEL BY RT-PCR (FLU A&B, COVID) ARPGX2 - Abnormal; Notable for the following components:      Result Value   Influenza A by PCR POSITIVE (*)    All other components within normal limits  GROUP A STREP BY PCR   ____________________________________________   ____________________________________________  RADIOLOGY    ____________________________________________   PROCEDURES  Procedure(s) performed: No  Procedures    ____________________________________________   INITIAL IMPRESSION / ASSESSMENT AND PLAN / ED COURSE  Pertinent labs & imaging results that were available during my care of the patient were reviewed by me and considered in my medical decision making (see chart for details).   Patient is a 35 year old female presents with URI symptoms.  See HPI.  Physical exam shows patient per stable  Strep test and COVID test ordered, DuoNeb as patient states her chest feels tight although her lungs appear to be normal  Strep test negative, flu test is positive,  COVID test negative  Patient feels better with the DuoNeb.  She is given prescription for an inhaler.  Follow-up with her regular doctor if not improved in 3 to 4 days.  She is to remain out of work until she has been fever free for 24 hours.  Work note was given.  She is discharged stable condition.  Over-the-counter measures were discussed  Denna Fryberger was evaluated in Emergency Department on 02/19/2021 for the symptoms described in the history of present illness. She was evaluated in the context of the global COVID-19 pandemic, which necessitated consideration that the patient might be at risk for infection with the SARS-CoV-2 virus that causes COVID-19. Institutional protocols and algorithms that pertain to the evaluation of patients at risk for COVID-19 are in a state of rapid change based on information released by regulatory bodies including the CDC and federal and state organizations.  These policies and algorithms were followed during the patient's care in the ED.    As part of my medical decision making, I reviewed the following data within the [place] notes reviewed and incorporated, Labs reviewed , Old chart reviewed, Notes from prior ED visits, and [place] Controlled Substance Database  ____________________________________________   FINAL CLINICAL IMPRESSION(S) / ED DIAGNOSES  Final diagnoses:  Influenza A      NEW MEDICATIONS STARTED DURING THIS VISIT:  Current Discharge Medication List       Note:  This document was prepared using Dragon voice recognition software and may include unintentional dictation errors.    Versie Starks, PA-C 02/19/21 [id number]    Nena Polio, MD 02/19/21 [phone]

## 2021-02-19 NOTE — ED Triage Notes (Signed)
Pt to ED from home c/o sore throat, fever, body aches since Thursday.  Pt A&Ox4, cough, chest rise even and unlabored, in NAD at this time.

## 2021-02-19 NOTE — Discharge Instructions (Signed)
You have influenza, your covid test is negative Take tylenol and ibuprofen for fever as needed Return to the ER if worsening Follow up with your regular doctor if not improving in 3 to 5 days

## 2021-02-22 NOTE — Telephone Encounter (Signed)
Mirena unable to be placed d/t cervical stenosis. Patient decided on nexplanon. Nexplanon rcvd/charged 03/18/20

## 2021-05-05 NOTE — Progress Notes (Signed)
PCP:  [facility]   Chief Complaint  Patient presents with   Annual Exam     HPI:      [name] is a 36 y.o. G0P0000 whose LMP was Patient's last menstrual period was 03/16/2021 (approximate)., presents today for her annual examination.  Her menses are infrequent with nexplanon, lasting 7 days and light flow, no dysmen.   Sex activity: not currently sexually active - Nexplanon placed 03/18/20  Last Pap: 05/05/19 Results were: no abnormalities /neg HPV DNA; no hx of abn paps  There is no FH of breast cancer. There is no FH of ovarian cancer. The patient does do self-breast exams.  Tobacco use: The patient denies current or previous tobacco use. Alcohol use: none No drug use.  Exercise: moderately active  She does get adequate calcium but not Vitamin D in her diet.  Patient Active Problem List   Diagnosis Date Noted   History of weight loss surgery 12/26/2017   Class 3 obesity with serious comorbidity and body mass index (BMI) of 50.0 to 59.9 in adult 08/07/2016   Dermoid cyst of both ovaries 07/17/2015   Hypertension 06/05/2014   Diabetes mellitus type I ([place]) 06/05/2014    Past Surgical History:  Procedure Laterality Date   CYST EXCISION     LAPAROTOMY N/A 07/17/2015   Procedure: LAPAROTOMY;  Surgeon: Gae Dry, MD;  Location: [facility] ORS;  Service: Gynecology;  Laterality: N/A;   OVARIAN CYST REMOVAL Bilateral 07/17/2015   Procedure: OVARIAN CYSTECTOMY;  Surgeon: Gae Dry, MD;  Location: [facility] ORS;  Service: Gynecology;  Laterality: Bilateral;   OVARIAN CYST REMOVAL      Family History  Problem Relation Age of Onset   Hyperlipidemia Maternal Grandfather    Hypertension Maternal Grandfather    Hypertension Paternal Grandmother    Hypertension Mother    Diabetes Mother     Social History   Socioeconomic History   Marital status: Single    Spouse name: Not on file   Number of children: Not on file   Years of education: Not on  file   Highest education level: Not on file  Occupational History   Not on file  Tobacco Use   Smoking status: Never   Smokeless tobacco: Never  Vaping Use   Vaping Use: Never used  Substance and Sexual Activity   Alcohol use: No   Drug use: No   Sexual activity: Yes    Birth control/protection: Implant  Other Topics Concern   Not on file  Social History Narrative   Not on file   Social Determinants of Health   Financial Resource Strain: Not on file  Food Insecurity: Not on file  Transportation Needs: Not on file  Physical Activity: Not on file  Stress: Not on file  Social Connections: Not on file  Intimate Partner Violence: Not on file     Current Outpatient Medications:    albuterol (VENTOLIN HFA) 108 (90 Base) MCG/ACT inhaler, Inhale 1-2 puffs into the lungs every 6 (six) hours as needed for wheezing or shortness of breath., Disp: 18 g, Rfl: 0   cetirizine (ZYRTEC) 10 MG tablet, cetirizine 10 mg tablet, Disp: , Rfl:    clindamycin (CLEOCIN-T) 1 % external solution, Apply topically daily. Qd to aa back, Disp: 60 mL, Rfl: 6   DIFFERIN 0.3 % gel, Apply 1 application topically at bedtime. Qhs to face for acne, Disp: 45 g, Rfl: 6   etonogestrel (NEXPLANON) 68 MG IMPL  implant, 1 each by Subdermal route once., Disp: , Rfl:    ipratropium (ATROVENT) 0.03 % nasal spray, ipratropium bromide 21 mcg (0.03 %) nasal spray  USE 2 SPRAY(S) IN EACH NOSTRIL TWICE DAILY AS NEEDED FOR RUNNY NOSE, Disp: , Rfl:    tazarotene (TAZORAC) 0.05 % cream, Apply topically at bedtime. qhs to face for acne, Disp: 60 g, Rfl: 6   ACZONE 7.5 % GEL, Apply 1 application topically every morning. Qam to face for acne (Patient not taking: Reported on 05/06/2021), Disp: 60 g, Rfl: 6   EPIDUO FORTE 0.3-2.5 % GEL, Apply 1 application topically at bedtime. (Patient not taking: Reported on 05/06/2021), Disp: 45 g, Rfl: 5   topiramate (TOPAMAX) 25 MG tablet, Take by mouth.  (Patient not taking: Reported on 05/06/2021),  Disp: , Rfl:      ROS:  Review of Systems  Constitutional:  Negative for fatigue, fever and unexpected weight change.  Respiratory:  Negative for cough, shortness of breath and wheezing.   Cardiovascular:  Negative for chest pain, palpitations and leg swelling.  Gastrointestinal:  Negative for blood in stool, constipation, diarrhea, nausea and vomiting.  Endocrine: Negative for cold intolerance, heat intolerance and polyuria.  Genitourinary:  Negative for dyspareunia, dysuria, flank pain, frequency, genital sores, hematuria, menstrual problem, pelvic pain, urgency, vaginal bleeding, vaginal discharge and vaginal pain.  Musculoskeletal:  Negative for back pain, joint swelling and myalgias.  Skin:  Negative for rash.  Neurological:  Negative for dizziness, syncope, light-headedness, numbness and headaches.  Hematological:  Negative for adenopathy.  Psychiatric/Behavioral:  Negative for agitation, confusion, sleep disturbance and suicidal ideas. The patient is not nervous/anxious.   BREAST: No symptoms   Objective: BP 140/90    Pulse 90    Ht 5\' 4"  (1.626 m)    Wt 274 lb (124.3 kg)    LMP 03/16/2021 (Approximate)    BMI 47.03 kg/m    Physical Exam Constitutional:      Appearance: She is well-developed.  Genitourinary:     Vulva normal.     Right Labia: No rash, tenderness or lesions.    Left Labia: No tenderness, lesions or rash.    No vaginal discharge, erythema or tenderness.      Right Adnexa: not tender and no mass present.    Left Adnexa: not tender and no mass present.    No cervical friability or polyp.     Uterus is not enlarged or tender.  Breasts:    Right: No mass, nipple discharge, skin change or tenderness.     Left: No mass, nipple discharge, skin change or tenderness.  Neck:     Thyroid: No thyromegaly.  Cardiovascular:     Rate and Rhythm: Normal rate and regular rhythm.     Heart sounds: Normal heart sounds. No murmur heard. Pulmonary:     Effort:  Pulmonary effort is normal.     Breath sounds: Normal breath sounds.  Abdominal:     Palpations: Abdomen is soft.     Tenderness: There is no abdominal tenderness. There is no guarding or rebound.  Musculoskeletal:        General: Normal range of motion.     Cervical back: Normal range of motion.  Lymphadenopathy:     Cervical: No cervical adenopathy.  Neurological:     General: No focal deficit present.     Mental Status: She is alert and oriented to person, place, and time.     Cranial Nerves: No cranial nerve deficit.  Skin:    General: Skin is warm and dry.  Psychiatric:        Mood and Affect: Mood normal.        Behavior: Behavior normal.        Thought Content: Thought content normal.        Judgment: Judgment normal.  Vitals reviewed.    Assessment/Plan: Encounter for annual routine gynecological examination  Encounter for surveillance of implantable subdermal contraceptive--placed 11/21, due for removal 11/24    GYN counsel adequate intake of calcium and vitamin D, diet and exercise     F/U  Return in about 1 year (around 05/06/2022).  Simone Tuckey B. Rody Keadle, PA-C 05/06/2021 11:19 AM

## 2021-05-06 ENCOUNTER — Ambulatory Visit (INDEPENDENT_AMBULATORY_CARE_PROVIDER_SITE_OTHER): Payer: BC Managed Care – PPO | Admitting: Obstetrics and Gynecology

## 2021-05-06 ENCOUNTER — Other Ambulatory Visit: Payer: Self-pay

## 2021-05-06 ENCOUNTER — Encounter: Payer: Self-pay | Admitting: Obstetrics and Gynecology

## 2021-05-06 VITALS — BP 140/90 | HR 90 | Ht 64.0 in | Wt 274.0 lb

## 2021-05-06 DIAGNOSIS — Z3046 Encounter for surveillance of implantable subdermal contraceptive: Secondary | ICD-10-CM

## 2021-05-06 DIAGNOSIS — Z01419 Encounter for gynecological examination (general) (routine) without abnormal findings: Secondary | ICD-10-CM

## 2021-05-06 NOTE — Patient Instructions (Signed)
I value your feedback and you entrusting us with your care. If you get a [place] patient survey, I would appreciate you taking the time to let us know about your experience today. Thank you! ? ? ?

## 2021-05-19 ENCOUNTER — Ambulatory Visit: Payer: Medicaid Other | Admitting: Dermatology

## 2021-08-18 ENCOUNTER — Ambulatory Visit: Payer: Medicaid Other | Admitting: Dermatology

## 2021-08-18 DIAGNOSIS — L732 Hidradenitis suppurativa: Secondary | ICD-10-CM | POA: Diagnosis not present

## 2021-08-18 DIAGNOSIS — L700 Acne vulgaris: Secondary | ICD-10-CM

## 2021-08-18 MED ORDER — RETIN-A 0.025 % EX GEL: Freq: Every day | CUTANEOUS | 6 refills | Status: DC

## 2021-08-18 MED ORDER — CLINDAMYCIN PHOSPHATE 1 % EX SOLN: Freq: Every day | CUTANEOUS | 0 refills | Status: DC
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
## 2021-08-18 NOTE — Patient Instructions (Signed)

## 2021-08-27 ENCOUNTER — Encounter: Payer: Self-pay | Admitting: Dermatology

## 2022-02-20 ENCOUNTER — Ambulatory Visit: Payer: Medicaid Other | Admitting: Dermatology

## 2022-05-10 ENCOUNTER — Ambulatory Visit: Payer: Medicaid Other | Admitting: Dermatology

## 2022-05-10 DIAGNOSIS — L732 Hidradenitis suppurativa: Secondary | ICD-10-CM

## 2022-05-10 DIAGNOSIS — Z7189 Other specified counseling: Secondary | ICD-10-CM | POA: Diagnosis not present

## 2022-05-10 DIAGNOSIS — L700 Acne vulgaris: Secondary | ICD-10-CM

## 2022-05-10 DIAGNOSIS — Z79899 Other long term (current) drug therapy: Secondary | ICD-10-CM | POA: Diagnosis not present

## 2022-05-10 NOTE — Progress Notes (Unsigned)
Follow-Up Visit   Subjective  Leslie Berger is a 37 y.o. female who presents for the following: Follow-up (F/u on hidradenitis suppurativa  ).    The following portions of the chart were reviewed this encounter and updated as appropriate:       Review of Systems:  No other skin or systemic complaints except as noted in HPI or Assessment and Plan.  Objective  Well appearing patient in no apparent distress; mood and affect are within normal limits.  A focused examination was performed including back . Relevant physical exam findings are noted in the Assessment and Plan.  Mid Back #2 36m tiny ulcers in the grove of the spine     Assessment & Plan  Hidradenitis suppurativa Mid Back  Chronic and persistent condition with duration or expected duration over one year. Condition is symptomatic / bothersome to patient. Not to goal.   Hidradenitis Suppurativa is a chronic; persistent; non-curable, but treatable condition due to abnormal inflamed sweat glands in the body folds (axilla, inframammary, groin, medial thighs), causing recurrent painful draining cysts and scarring. It can be associated with severe scarring acne and cysts; also abscesses and scarring of scalp. The goal is control and prevention of flares, as it is not curable. Scars are permanent and can be thickened. Treatment may include daily use of topical medication and oral antibiotics.  Oral isotretinoin may also be helpful.  For some cases, Humira or Cosentyx (biologic injections) may be prescribed to decrease the inflammatory process and prevent flares.  When indicated, inflamed cysts may also be treated surgically.   Recommend starting Isotretinoin therapy  Isotretinoin Counseling; Review and Contraception Counseling: Reviewed potential side effects of isotretinoin including xerosis, cheilitis, hepatitis, hyperlipidemia, and severe birth defects if taken by a pregnant woman.  Women on isotretinoin must be celibate  (not having sex) or required to use at least 2 birth control methods to prevent pregnancy (unless patient is a female of non-child bearing potential).  Females of child-bearing potential must have monthly pregnancy tests while on isotretinoin and report through I-Pledge (FDA monitoring program). Reviewed reports of suicidal ideation in those with a history of depression while taking isotretinoin and reports of diagnosis of inflammatory bowl disease (IBD) while taking isotretinoin as well as the lack of evidence for a causal relationship between isotretinoin, depression and IBD. Patient advised to reach out with any questions or concerns. Patient advised not to share pills or donate blood while on treatment or for one month after completing treatment. All patient's considering Isotretinoin must read and understand and sign Isotretinoin Consent Form and be registered with I-Pledge.   Urine pregnancy test performed in office today and was negative.  Patient demonstrates comprehension and confirms she will not get pregnant.    Ipledge consents signed  Ipledge REMS ID [id number] Birth control NEXPLANON implant    Acne vulgaris face  Isotretinoin Counseling; Review and Contraception Counseling: Reviewed potential side effects of isotretinoin including xerosis, cheilitis, hepatitis, hyperlipidemia, and severe birth defects if taken by a pregnant woman.  Women on isotretinoin must be celibate (not having sex) or required to use at least 2 birth control methods to prevent pregnancy (unless patient is a female of non-child bearing potential).  Females of child-bearing potential must have monthly pregnancy tests while on isotretinoin and report through I-Pledge (FDA monitoring program). Reviewed reports of suicidal ideation in those with a history of depression while taking isotretinoin and reports of diagnosis of inflammatory bowl disease (IBD) while taking isotretinoin  as well as the lack of evidence for a  causal relationship between isotretinoin, depression and IBD. Patient advised to reach out with any questions or concerns. Patient advised not to share pills or donate blood while on treatment or for one month after completing treatment. All patient's considering Isotretinoin must read and understand and sign Isotretinoin Consent Form and be registered with I-Pledge.    Return in about 4 weeks (around 06/07/2022) for start Isot.  IMarye Round, CMA, am acting as scribe for Sarina Ser, MD .

## 2022-05-10 NOTE — Patient Instructions (Signed)
Due to recent changes in healthcare laws, you may see results of your pathology and/or laboratory studies on MyChart before the doctors have had a chance to review them. We understand that in some cases there may be results that are confusing or concerning to you. Please understand that not all results are received at the same time and often the doctors may need to interpret multiple results in order to provide you with the best plan of care or course of treatment. Therefore, we ask that you please give us 2 business days to thoroughly review all your results before contacting the office for clarification. Should we see a critical lab result, you will be contacted sooner.   If You Need Anything After Your Visit  If you have any questions or concerns for your doctor, please call our main line at [phone] and press option 4 to reach your doctor's medical assistant. If no one answers, please leave a voicemail as directed and we will return your call as soon as possible. Messages left after 4 pm will be answered the following business day.   You may also send us a message via MyChart. We typically respond to MyChart messages within 1-2 business days.  For prescription refills, please ask your pharmacy to contact our office. Our fax number is [phone].  If you have an urgent issue when the clinic is closed that cannot wait until the next business day, you can page your doctor at the number below.    Please note that while we do our best to be available for urgent issues outside of office hours, we are not available 24/7.   If you have an urgent issue and are unable to reach us, you may choose to seek medical care at your doctor's office, retail clinic, urgent care center, or emergency room.  If you have a medical emergency, please immediately call 911 or go to the emergency department.  Pager Numbers  - Dr. Kowalski: [phone]  - Dr. Moye: [phone]  - Dr. Stewart:  [phone]  In the event of inclement weather, please call our main line at [phone] for an update on the status of any delays or closures.  Dermatology Medication Tips: Please keep the boxes that topical medications come in in order to help keep track of the instructions about where and how to use these. Pharmacies typically print the medication instructions only on the boxes and not directly on the medication tubes.   If your medication is too expensive, please contact our office at [phone] option 4 or send us a message through MyChart.   We are unable to tell what your co-pay for medications will be in advance as this is different depending on your insurance coverage. However, we may be able to find a substitute medication at lower cost or fill out paperwork to get insurance to cover a needed medication.   If a prior authorization is required to get your medication covered by your insurance company, please allow us 1-2 business days to complete this process.  Drug prices often vary depending on where the prescription is filled and some pharmacies may offer cheaper prices.  The website www.goodrx.com contains coupons for medications through different pharmacies. The prices here do not account for what the cost may be with help from insurance (it may be cheaper with your insurance), but the website can give you the price if you did not use any insurance.  - You can print the associated coupon and take it with   your prescription to the pharmacy.  - You may also stop by our office during regular business hours and pick up a GoodRx coupon card.  - If you need your prescription sent electronically to a different pharmacy, notify our office through [place] MyChart or by phone at [phone] option 4.     Si Usted Necesita Algo Despus de Su Visita  Tambin puede enviarnos un mensaje a travs de MyChart. Por lo general respondemos a los mensajes de MyChart en el transcurso de 1 a 2  das hbiles.  Para renovar recetas, por favor pida a su farmacia que se ponga en contacto con nuestra oficina. Nuestro nmero de fax es el [phone].  Si tiene un asunto urgente cuando la clnica est cerrada y que no puede esperar hasta el siguiente da hbil, puede llamar/localizar a su doctor(a) al nmero que aparece a continuacin.   Por favor, tenga en cuenta que aunque hacemos todo lo posible para estar disponibles para asuntos urgentes fuera del horario de oficina, no estamos disponibles las 24 horas del da, los 7 das de la semana.   Si tiene un problema urgente y no puede comunicarse con nosotros, puede optar por buscar atencin mdica  en el consultorio de su doctor(a), en una clnica privada, en un centro de atencin urgente o en una sala de emergencias.  Si tiene una emergencia mdica, por favor llame inmediatamente al 911 o vaya a la sala de emergencias.  Nmeros de bper  - Dr. Kowalski: [phone]  - Dra. Moye: [phone]  - Dra. Stewart: [phone]  En caso de inclemencias del tiempo, por favor llame a nuestra lnea principal al [phone] para una actualizacin sobre el estado de cualquier retraso o cierre.  Consejos para la medicacin en dermatologa: Por favor, guarde las cajas en las que vienen los medicamentos de uso tpico para ayudarle a seguir las instrucciones sobre dnde y cmo usarlos. Las farmacias generalmente imprimen las instrucciones del medicamento slo en las cajas y no directamente en los tubos del medicamento.   Si su medicamento es muy caro, por favor, pngase en contacto con nuestra oficina llamando al [phone] y presione la opcin 4 o envenos un mensaje a travs de MyChart.   No podemos decirle cul ser su copago por los medicamentos por adelantado ya que esto es diferente dependiendo de la cobertura de su seguro. Sin embargo, es posible que podamos encontrar un medicamento sustituto a menor costo o llenar un formulario para que el  seguro cubra el medicamento que se considera necesario.   Si se requiere una autorizacin previa para que su compaa de seguros cubra su medicamento, por favor permtanos de 1 a 2 das hbiles para completar este proceso.  Los precios de los medicamentos varan con frecuencia dependiendo del lugar de dnde se surte la receta y alguna farmacias pueden ofrecer precios ms baratos.  El sitio web www.goodrx.com tiene cupones para medicamentos de diferentes farmacias. Los precios aqu no tienen en cuenta lo que podra costar con la ayuda del seguro (puede ser ms barato con su seguro), pero el sitio web puede darle el precio si no utiliz ningn seguro.  - Puede imprimir el cupn correspondiente y llevarlo con su receta a la farmacia.  - Tambin puede pasar por nuestra oficina durante el horario de atencin regular y recoger una tarjeta de cupones de GoodRx.  - Si necesita que su receta se enve electrnicamente a una farmacia diferente, informe a nuestra oficina a travs de MyChart de [place]   o por telfono llamando al [phone] y presione la opcin 4.  

## 2022-05-11 ENCOUNTER — Encounter: Payer: Self-pay | Admitting: Dermatology

## 2022-06-14 ENCOUNTER — Ambulatory Visit (INDEPENDENT_AMBULATORY_CARE_PROVIDER_SITE_OTHER): Payer: BC Managed Care – PPO | Admitting: Dermatology

## 2022-06-14 ENCOUNTER — Ambulatory Visit: Payer: Medicaid Other | Admitting: Dermatology

## 2022-06-14 VITALS — Wt 248.0 lb

## 2022-06-14 DIAGNOSIS — L732 Hidradenitis suppurativa: Secondary | ICD-10-CM

## 2022-06-14 DIAGNOSIS — L700 Acne vulgaris: Secondary | ICD-10-CM

## 2022-06-14 NOTE — Patient Instructions (Signed)
Due to recent changes in healthcare laws, you may see results of your pathology and/or laboratory studies on MyChart before the doctors have had a chance to review them. We understand that in some cases there may be results that are confusing or concerning to you. Please understand that not all results are received at the same time and often the doctors may need to interpret multiple results in order to provide you with the best plan of care or course of treatment. Therefore, we ask that you please give us 2 business days to thoroughly review all your results before contacting the office for clarification. Should we see a critical lab result, you will be contacted sooner.   If You Need Anything After Your Visit  If you have any questions or concerns for your doctor, please call our main line at [phone] and press option 4 to reach your doctor's medical assistant. If no one answers, please leave a voicemail as directed and we will return your call as soon as possible. Messages left after 4 pm will be answered the following business day.   You may also send us a message via MyChart. We typically respond to MyChart messages within 1-2 business days.  For prescription refills, please ask your pharmacy to contact our office. Our fax number is [phone].  If you have an urgent issue when the clinic is closed that cannot wait until the next business day, you can page your doctor at the number below.    Please note that while we do our best to be available for urgent issues outside of office hours, we are not available 24/7.   If you have an urgent issue and are unable to reach us, you may choose to seek medical care at your doctor's office, retail clinic, urgent care center, or emergency room.  If you have a medical emergency, please immediately call 911 or go to the emergency department.  Pager Numbers  - Dr. Kowalski: [phone]  - Dr. Moye: [phone]  - Dr. Stewart:  [phone]  In the event of inclement weather, please call our main line at [phone] for an update on the status of any delays or closures.  Dermatology Medication Tips: Please keep the boxes that topical medications come in in order to help keep track of the instructions about where and how to use these. Pharmacies typically print the medication instructions only on the boxes and not directly on the medication tubes.   If your medication is too expensive, please contact our office at [phone] option 4 or send us a message through MyChart.   We are unable to tell what your co-pay for medications will be in advance as this is different depending on your insurance coverage. However, we may be able to find a substitute medication at lower cost or fill out paperwork to get insurance to cover a needed medication.   If a prior authorization is required to get your medication covered by your insurance company, please allow us 1-2 business days to complete this process.  Drug prices often vary depending on where the prescription is filled and some pharmacies may offer cheaper prices.  The website www.goodrx.com contains coupons for medications through different pharmacies. The prices here do not account for what the cost may be with help from insurance (it may be cheaper with your insurance), but the website can give you the price if you did not use any insurance.  - You can print the associated coupon and take it with   your prescription to the pharmacy.  - You may also stop by our office during regular business hours and pick up a GoodRx coupon card.  - If you need your prescription sent electronically to a different pharmacy, notify our office through [place] MyChart or by phone at [phone] option 4.     Si Usted Necesita Algo Despus de Su Visita  Tambin puede enviarnos un mensaje a travs de MyChart. Por lo general respondemos a los mensajes de MyChart en el transcurso de 1 a 2  das hbiles.  Para renovar recetas, por favor pida a su farmacia que se ponga en contacto con nuestra oficina. Nuestro nmero de fax es el [phone].  Si tiene un asunto urgente cuando la clnica est cerrada y que no puede esperar hasta el siguiente da hbil, puede llamar/localizar a su doctor(a) al nmero que aparece a continuacin.   Por favor, tenga en cuenta que aunque hacemos todo lo posible para estar disponibles para asuntos urgentes fuera del horario de oficina, no estamos disponibles las 24 horas del da, los 7 das de la semana.   Si tiene un problema urgente y no puede comunicarse con nosotros, puede optar por buscar atencin mdica  en el consultorio de su doctor(a), en una clnica privada, en un centro de atencin urgente o en una sala de emergencias.  Si tiene una emergencia mdica, por favor llame inmediatamente al 911 o vaya a la sala de emergencias.  Nmeros de bper  - Dr. Kowalski: [phone]  - Dra. Moye: [phone]  - Dra. Stewart: [phone]  En caso de inclemencias del tiempo, por favor llame a nuestra lnea principal al [phone] para una actualizacin sobre el estado de cualquier retraso o cierre.  Consejos para la medicacin en dermatologa: Por favor, guarde las cajas en las que vienen los medicamentos de uso tpico para ayudarle a seguir las instrucciones sobre dnde y cmo usarlos. Las farmacias generalmente imprimen las instrucciones del medicamento slo en las cajas y no directamente en los tubos del medicamento.   Si su medicamento es muy caro, por favor, pngase en contacto con nuestra oficina llamando al [phone] y presione la opcin 4 o envenos un mensaje a travs de MyChart.   No podemos decirle cul ser su copago por los medicamentos por adelantado ya que esto es diferente dependiendo de la cobertura de su seguro. Sin embargo, es posible que podamos encontrar un medicamento sustituto a menor costo o llenar un formulario para que el  seguro cubra el medicamento que se considera necesario.   Si se requiere una autorizacin previa para que su compaa de seguros cubra su medicamento, por favor permtanos de 1 a 2 das hbiles para completar este proceso.  Los precios de los medicamentos varan con frecuencia dependiendo del lugar de dnde se surte la receta y alguna farmacias pueden ofrecer precios ms baratos.  El sitio web www.goodrx.com tiene cupones para medicamentos de diferentes farmacias. Los precios aqu no tienen en cuenta lo que podra costar con la ayuda del seguro (puede ser ms barato con su seguro), pero el sitio web puede darle el precio si no utiliz ningn seguro.  - Puede imprimir el cupn correspondiente y llevarlo con su receta a la farmacia.  - Tambin puede pasar por nuestra oficina durante el horario de atencin regular y recoger una tarjeta de cupones de GoodRx.  - Si necesita que su receta se enve electrnicamente a una farmacia diferente, informe a nuestra oficina a travs de MyChart de    o por telfono llamando al [phone] y presione la opcin 4.  

## 2022-06-14 NOTE — Progress Notes (Signed)
   Follow-Up Visit   Subjective  Leslie Berger is a 37 y.o. female who presents for the following: Follow-up (Patient here to get started on Isotretinoin therapy for Hidradenitis suppurativa  ).    The following portions of the chart were reviewed this encounter and updated as appropriate:       Review of Systems:  No other skin or systemic complaints except as noted in HPI or Assessment and Plan.  Objective  Well appearing patient in no apparent distress; mood and affect are within normal limits.  A focused examination was performed including axilla,back. Relevant physical exam findings are noted in the Assessment and Plan.  Right Axilla, back, groin Hyperpigmented firm nodules with ropey thickened scarring  face Inflammatory papules at bilateral jaw     Assessment & Plan  Hidradenitis suppurativa Right Axilla, back, groin  Chronic and persistent condition with duration or expected duration over one year. Condition is symptomatic/ bothersome to patient. Improving but not currently at goal.   Hidradenitis Suppurativa is a chronic; persistent; non-curable, but treatable condition due to abnormal inflamed sweat glands in the body folds (axilla, inframammary, groin, medial thighs), causing recurrent painful draining cysts and scarring. It can be associated with severe scarring acne and cysts; also abscesses and scarring of scalp. The goal is control and prevention of flares, as it is not curable. Scars are permanent and can be thickened. Treatment may include daily use of topical medication and oral antibiotics.  Oral isotretinoin may also be helpful.  For some cases, Humira or Cosentyx (biologic injections) may be prescribed to decrease the inflammatory process and prevent flares.  When indicated, inflamed cysts may also be treated surgically.    Isotretinoin Counseling; Review and Contraception Counseling: Reviewed potential side effects of isotretinoin including xerosis,  cheilitis, hepatitis, hyperlipidemia, and severe birth defects if taken by a pregnant woman.  Women on isotretinoin must be celibate (not having sex) or required to use at least 2 birth control methods to prevent pregnancy (unless patient is a female of non-child bearing potential).  Females of child-bearing potential must have monthly pregnancy tests while on isotretinoin and report through I-Pledge (FDA monitoring program). Reviewed reports of suicidal ideation in those with a history of depression while taking isotretinoin and reports of diagnosis of inflammatory bowl disease (IBD) while taking isotretinoin as well as the lack of evidence for a causal relationship between isotretinoin, depression and IBD. Patient advised to reach out with any questions or concerns. Patient advised not to share pills or donate blood while on treatment or for one month after completing treatment. All patient's considering Isotretinoin must read and understand and sign Isotretinoin Consent Form and be registered with I-Pledge.   Labs ordered CMP, Lipid panel, HCG qualitative   Pending normal labs plan on starting Isotretinoin 30 mg once a day  Related Procedures Comprehensive metabolic panel Lipid panel hCG, serum, qualitative  Acne vulgaris face  Chronic and persistent condition with duration or expected duration over one year. Condition is symptomatic / bothersome to patient. Not to goal.   Plan on starting Isotretinoin 30 mg once a day pending normal labs.  Discussed potential side effects as above.   Return in about 4 weeks (around 07/12/2022) for Isotretinoin .  I, Marye Round, CMA, am acting as scribe for Brendolyn Patty, MD .   Documentation: I have reviewed the above documentation for accuracy and completeness, and I agree with the above.  Brendolyn Patty MD

## 2022-06-15 LAB — COMPREHENSIVE METABOLIC PANEL
ALT: 13 IU/L (ref 0–32)
AST: 19 IU/L (ref 0–40)
Albumin/Globulin Ratio: 1.3 (ref 1.2–2.2)
Albumin: 3.8 g/dL — ABNORMAL LOW (ref 3.9–4.9)
Alkaline Phosphatase: 84 IU/L (ref 44–121)
BUN/Creatinine Ratio: 10 (ref 9–23)
BUN: 9 mg/dL (ref 6–20)
Bilirubin Total: <0.2 mg/dL (ref 0.0–1.2)
CO2: 22 mmol/L (ref 20–29)
Calcium: 9.1 mg/dL (ref 8.7–10.2)
Chloride: 106 mmol/L (ref 96–106)
Creatinine, Ser: 0.92 mg/dL (ref 0.57–1.00)
Globulin, Total: 2.9 g/dL (ref 1.5–4.5)
Glucose: 100 mg/dL — ABNORMAL HIGH (ref 70–99)
Potassium: 3.7 mmol/L (ref 3.5–5.2)
Sodium: 140 mmol/L (ref 134–144)
Total Protein: 6.7 g/dL (ref 6.0–8.5)
eGFR: 84 mL/min/{1.73_m2} (ref >59)

## 2022-06-15 LAB — LIPID PANEL
Chol/HDL Ratio: 3 ratio (ref 0.0–4.4)
Cholesterol, Total: 136 mg/dL (ref 100–199)
HDL: 45 mg/dL (ref >39)
LDL Chol Calc (NIH): 72 mg/dL (ref 0–99)
Triglycerides: 103 mg/dL (ref 0–149)
VLDL Cholesterol Cal: 19 mg/dL (ref 5–40)

## 2022-06-15 LAB — HCG, SERUM, QUALITATIVE: hCG,Beta Subunit,Qual,Serum: NEGATIVE m[IU]/mL (ref <6)

## 2022-06-19 ENCOUNTER — Telehealth: Payer: Self-pay

## 2022-06-19 MED ORDER — ISOTRETINOIN 30 MG PO CAPS: 30.0000 mg | ORAL_CAPSULE | Freq: Every day | ORAL | 0 refills | Status: DC

## 2022-06-19 NOTE — Telephone Encounter (Signed)
-----   Message from Brendolyn Patty, MD sent at 06/15/2022  3:54 PM EST ----- Labs ok, send in isotretinoin as per note - please call patient

## 2022-06-19 NOTE — Telephone Encounter (Signed)
Advised pt of lab results.  Pt confirmed in [place] program.  Isotretinoin 73m 1 po qd with fatty meal #[facility]

## 2022-07-17 ENCOUNTER — Ambulatory Visit (INDEPENDENT_AMBULATORY_CARE_PROVIDER_SITE_OTHER): Payer: BC Managed Care – PPO | Admitting: Dermatology

## 2022-07-17 VITALS — BP 158/84

## 2022-07-17 DIAGNOSIS — L700 Acne vulgaris: Secondary | ICD-10-CM

## 2022-07-17 DIAGNOSIS — L853 Xerosis cutis: Secondary | ICD-10-CM | POA: Diagnosis not present

## 2022-07-17 DIAGNOSIS — K130 Diseases of lips: Secondary | ICD-10-CM

## 2022-07-17 DIAGNOSIS — Z79899 Other long term (current) drug therapy: Secondary | ICD-10-CM

## 2022-07-17 DIAGNOSIS — L732 Hidradenitis suppurativa: Secondary | ICD-10-CM

## 2022-07-17 MED ORDER — CLINDAMYCIN PHOSPHATE 1 % EX LOTN: TOPICAL_LOTION | CUTANEOUS | 6 refills | Status: DC

## 2022-07-17 MED ORDER — ISOTRETINOIN 40 MG PO CAPS: 40.0000 mg | ORAL_CAPSULE | Freq: Every day | ORAL | 0 refills | Status: DC

## 2022-07-17 NOTE — Progress Notes (Signed)
Isotretinoin Follow-Up Visit   Subjective  Leslie Berger is a 37 y.o. female who presents for the following: HS (R axilla, back, groin, Isotretinoin 30mg  1 po qd wk 4) and Acne (Face, Isotretinoin)  Week # 4   Isotretinoin F/U - 07/17/22 1100       Isotretinoin Follow Up   iPledge # [id number]    Date 07/17/22    Two Forms of Birth Control Implant;Female Condom    Acne breakouts since last visit? Yes      Dosage   Target Dosage (mg) 22500    Current (To Date) Dosage (mg) 900    To Go Dosage (mg) 21600      Side Effects   Skin Dry Lips;Dry Skin    Gastrointestinal WNL    Neurological WNL    Constitutional WNL             Side effects: Dry skin, dry lips  Denies changes in night vision, shortness of breath, abdominal pain, nausea, vomiting, diarrhea, blood in stool or urine, visual changes, headaches, epistaxis, joint pain, myalgias, mood changes, depression, or suicidal ideation.   Patient is not pregnant, not seeking pregnancy, and not breastfeeding.   The following portions of the chart were reviewed this encounter and updated as appropriate: medications, allergies, medical history  Review of Systems:  No other skin or systemic complaints except as noted in HPI or Assessment and Plan.  Objective  Well appearing patient in no apparent distress; mood and affect are within normal limits.  An examination of the face, neck, chest, and back was performed and relevant findings are noted below.   Left Axilla Erythematous draining nodule mid lower back spinal, hyperpigmented ropey nodules, large cysts L axilla  face Closed comedones cheeks, hyperpigmented macules and paps face    Assessment & Plan   Hidradenitis suppurativa Left Axilla  Chronic and persistent condition with duration or expected duration over one year. Condition is symptomatic/ bothersome to patient. Not currently at goal.   Hidradenitis Suppurativa is a chronic; persistent; non-curable,  but treatable condition due to abnormal inflamed sweat glands in the body folds (axilla, inframammary, groin, medial thighs), causing recurrent painful draining cysts and scarring. It can be associated with severe scarring acne and cysts; also abscesses and scarring of scalp. The goal is control and prevention of flares, as it is not curable. Scars are permanent and can be thickened. Treatment may include daily use of topical medication and oral antibiotics.  Oral isotretinoin may also be helpful.  For some cases, Humira or Cosentyx (biologic injections) may be prescribed to decrease the inflammatory process and prevent flares.  When indicated, inflamed cysts may also be treated surgically.  Severe; On Isotretinoin -  requiring FDA mandated monthly evaluations and laboratory monitoring; Chronic and Persistent; Not to Goal   [facility] # [id number] [employer]- Nexplanon, female condoms [place] Total mg = 900mg  Total mg/kg = 3.6mg /kg Wk 4  Urine pregnancy test performed in office today and was negative.  Patient demonstrates comprehension and confirms she will not get pregnant. Lot [id number] exp 06/30/2023  Start Clindamycin lotion qd after bath to axilla, back Increase Isotretinoin to 40mg  1 po qd with fatty meal    clindamycin (CLEOCIN T) 1 % lotion - Left Axilla Apply topically as directed. Qd to aa axilla, back after shower  ISOtretinoin (ACCUTANE) 40 MG capsule - Left Axilla Take 1 capsule (40 mg total) by mouth daily. Take with fattty meal  Acne vulgaris face  Severe; On Isotretinoin -  requiring FDA mandated monthly evaluations and laboratory monitoring; Chronic and Persistent; Not to Goal   [facility] # [id number] [facility]- Nexplanon, female condoms [place] Total mg = 900mg  Total mg/kg = 3.6mg /kg Wk 4  Increase Isotretinoin to 40mg  1 po qd     Xerosis secondary to isotretinoin therapy - Continue emollients as directed - Xyzal (levocetirizine) once  a day and fish oil 1 gram daily may also help with dryness   Cheilitis secondary to isotretinoin therapy - Continue lip balm as directed, Dr. Luvenia Heller Cortibalm recommended   Long term medication management (isotretinoin) - While taking Isotretinoin and for 30 days after you finish the medication, do not get pregnant, do not share pills, do not donate blood. Isotretinoin is best absorbed when taken with a fatty meal. Isotretinoin can make you sensitive to the sun. Daily careful sun protection including sunscreen SPF 30+ when outdoors is recommended.  Follow-up in 30 days.  I, Othelia Pulling, RMA, am acting as scribe for Brendolyn Patty, MD .   Documentation: I have reviewed the above documentation for accuracy and completeness, and I agree with the above.  Brendolyn Patty, MD

## 2022-07-17 NOTE — Patient Instructions (Signed)
Due to recent changes in healthcare laws, you may see results of your pathology and/or laboratory studies on MyChart before the doctors have had a chance to review them. We understand that in some cases there may be results that are confusing or concerning to you. Please understand that not all results are received at the same time and often the doctors may need to interpret multiple results in order to provide you with the best plan of care or course of treatment. Therefore, we ask that you please give us 2 business days to thoroughly review all your results before contacting the office for clarification. Should we see a critical lab result, you will be contacted sooner.   If You Need Anything After Your Visit  If you have any questions or concerns for your doctor, please call our main line at [phone] and press option 4 to reach your doctor's medical assistant. If no one answers, please leave a voicemail as directed and we will return your call as soon as possible. Messages left after 4 pm will be answered the following business day.   You may also send us a message via MyChart. We typically respond to MyChart messages within 1-2 business days.  For prescription refills, please ask your pharmacy to contact our office. Our fax number is [phone].  If you have an urgent issue when the clinic is closed that cannot wait until the next business day, you can page your doctor at the number below.    Please note that while we do our best to be available for urgent issues outside of office hours, we are not available 24/7.   If you have an urgent issue and are unable to reach us, you may choose to seek medical care at your doctor's office, retail clinic, urgent care center, or emergency room.  If you have a medical emergency, please immediately call 911 or go to the emergency department.  Pager Numbers  - Dr. Kowalski: [phone]  - Dr. Moye: [phone]  - Dr. Stewart:  [phone]  In the event of inclement weather, please call our main line at [phone] for an update on the status of any delays or closures.  Dermatology Medication Tips: Please keep the boxes that topical medications come in in order to help keep track of the instructions about where and how to use these. Pharmacies typically print the medication instructions only on the boxes and not directly on the medication tubes.   If your medication is too expensive, please contact our office at [phone] option 4 or send us a message through MyChart.   We are unable to tell what your co-pay for medications will be in advance as this is different depending on your insurance coverage. However, we may be able to find a substitute medication at lower cost or fill out paperwork to get insurance to cover a needed medication.   If a prior authorization is required to get your medication covered by your insurance company, please allow us 1-2 business days to complete this process.  Drug prices often vary depending on where the prescription is filled and some pharmacies may offer cheaper prices.  The website www.goodrx.com contains coupons for medications through different pharmacies. The prices here do not account for what the cost may be with help from insurance (it may be cheaper with your insurance), but the website can give you the price if you did not use any insurance.  - You can print the associated coupon and take it with   your prescription to the pharmacy.  - You may also stop by our office during regular business hours and pick up a GoodRx coupon card.  - If you need your prescription sent electronically to a different pharmacy, notify our office through [place] MyChart or by phone at [phone] option 4.     Si Usted Necesita Algo Despus de Su Visita  Tambin puede enviarnos un mensaje a travs de MyChart. Por lo general respondemos a los mensajes de MyChart en el transcurso de 1 a 2  das hbiles.  Para renovar recetas, por favor pida a su farmacia que se ponga en contacto con nuestra oficina. Nuestro nmero de fax es el [phone].  Si tiene un asunto urgente cuando la clnica est cerrada y que no puede esperar hasta el siguiente da hbil, puede llamar/localizar a su doctor(a) al nmero que aparece a continuacin.   Por favor, tenga en cuenta que aunque hacemos todo lo posible para estar disponibles para asuntos urgentes fuera del horario de oficina, no estamos disponibles las 24 horas del da, los 7 das de la semana.   Si tiene un problema urgente y no puede comunicarse con nosotros, puede optar por buscar atencin mdica  en el consultorio de su doctor(a), en una clnica privada, en un centro de atencin urgente o en una sala de emergencias.  Si tiene una emergencia mdica, por favor llame inmediatamente al 911 o vaya a la sala de emergencias.  Nmeros de bper  - Dr. Kowalski: [phone]  - Dra. Moye: [phone]  - Dra. Stewart: [phone]  En caso de inclemencias del tiempo, por favor llame a nuestra lnea principal al [phone] para una actualizacin sobre el estado de cualquier retraso o cierre.  Consejos para la medicacin en dermatologa: Por favor, guarde las cajas en las que vienen los medicamentos de uso tpico para ayudarle a seguir las instrucciones sobre dnde y cmo usarlos. Las farmacias generalmente imprimen las instrucciones del medicamento slo en las cajas y no directamente en los tubos del medicamento.   Si su medicamento es muy caro, por favor, pngase en contacto con nuestra oficina llamando al [phone] y presione la opcin 4 o envenos un mensaje a travs de MyChart.   No podemos decirle cul ser su copago por los medicamentos por adelantado ya que esto es diferente dependiendo de la cobertura de su seguro. Sin embargo, es posible que podamos encontrar un medicamento sustituto a menor costo o llenar un formulario para que el  seguro cubra el medicamento que se considera necesario.   Si se requiere una autorizacin previa para que su compaa de seguros cubra su medicamento, por favor permtanos de 1 a 2 das hbiles para completar este proceso.  Los precios de los medicamentos varan con frecuencia dependiendo del lugar de dnde se surte la receta y alguna farmacias pueden ofrecer precios ms baratos.  El sitio web www.goodrx.com tiene cupones para medicamentos de diferentes farmacias. Los precios aqu no tienen en cuenta lo que podra costar con la ayuda del seguro (puede ser ms barato con su seguro), pero el sitio web puede darle el precio si no utiliz ningn seguro.  - Puede imprimir el cupn correspondiente y llevarlo con su receta a la farmacia.  - Tambin puede pasar por nuestra oficina durante el horario de atencin regular y recoger una tarjeta de cupones de GoodRx.  - Si necesita que su receta se enve electrnicamente a una farmacia diferente, informe a nuestra oficina a travs de MyChart de [place]   o por telfono llamando al [phone] y presione la opcin 4.  

## 2022-07-17 NOTE — Progress Notes (Deleted)
Isotretinoin Follow-Up Visit   Subjective  Leslie Berger is a 37 y.o. female who presents for the following: HS (R axilla, back, groin, Isotretinoin 30mg  1 po qd wk 4) and Acne (Face, Isotretinoin)  Week # 4   Isotretinoin F/U - 07/17/22 1100       Isotretinoin Follow Up   iPledge # [id number]    Two Forms of Birth Control Implant;Female Condom    Acne breakouts since last visit? Yes      Dosage   Target Dosage (mg) 22500    Current (To Date) Dosage (mg) 900    To Go Dosage (mg) 21600      Side Effects   Skin Dry Lips;Dry Skin    Gastrointestinal WNL    Neurological WNL    Constitutional WNL             Side effects: Dry skin, dry lips  Denies changes in night vision, shortness of breath, abdominal pain, nausea, vomiting, diarrhea, blood in stool or urine, visual changes, headaches, epistaxis, joint pain, myalgias, mood changes, depression, or suicidal ideation.   Patient is not pregnant, not seeking pregnancy, and not breastfeeding.   The following portions of the chart were reviewed this encounter and updated as appropriate: medications, allergies, medical history  Review of Systems:  No other skin or systemic complaints except as noted in HPI or Assessment and Plan.  Objective  Well appearing patient in no apparent distress; mood and affect are within normal limits.  An examination of the face, neck, chest, and back was performed and relevant findings are noted below.     Assessment & Plan   Hidradenitis suppurativa Left Axilla  Hidradenitis Suppurativa is a chronic; persistent; non-curable, but treatable condition due to abnormal inflamed sweat glands in the body folds (axilla, inframammary, groin, medial thighs), causing recurrent painful draining cysts and scarring. It can be associated with severe scarring acne and cysts; also abscesses and scarring of scalp. The goal is control and prevention of flares, as it is not curable. Scars are permanent and can  be thickened. Treatment may include daily use of topical medication and oral antibiotics.  Oral isotretinoin may also be helpful.  For some cases, Humira or Cosentyx (biologic injections) may be prescribed to decrease the inflammatory process and prevent flares.  When indicated, inflamed cysts may also be treated surgically.  [employer] # [id number] [employer]- Nexplanon, female condoms [place] Total mg = 900mg  Total mg/kg = 3.6mg /kg  Wk 4     Xerosis secondary to isotretinoin therapy - Continue emollients as directed - Xyzal (levocetirizine) once a day and fish oil 1 gram daily may also help with dryness   Cheilitis secondary to isotretinoin therapy - Continue lip balm as directed, Dr. Luvenia Heller Cortibalm recommended   Long term medication management (isotretinoin) - While taking Isotretinoin and for 30 days after you finish the medication, do not get pregnant, do not share pills, do not donate blood. Isotretinoin is best absorbed when taken with a fatty meal. Isotretinoin can make you sensitive to the sun. Daily careful sun protection including sunscreen SPF 30+ when outdoors is recommended.  Follow-up in 30 days.  I,Laylonie Marzec R [place],acting as a scribe for Brendolyn Patty, MD.,have documented all relevant documentation on the behalf of Brendolyn Patty, MD,as directed by  Brendolyn Patty, MD while in the presence of Brendolyn Patty, MD.  Documentation: I have reviewed the above documentation for accuracy and completeness, and I agree with the above.  Brendolyn Patty,  MD

## 2022-07-25 ENCOUNTER — Ambulatory Visit: Payer: BC Managed Care – PPO

## 2022-07-25 ENCOUNTER — Other Ambulatory Visit: Payer: Self-pay

## 2022-07-25 DIAGNOSIS — L732 Hidradenitis suppurativa: Secondary | ICD-10-CM

## 2022-07-25 MED ORDER — ISOTRETINOIN 40 MG PO CAPS: 40.0000 mg | ORAL_CAPSULE | Freq: Every day | ORAL | 0 refills | Status: DC

## 2022-07-25 NOTE — Progress Notes (Signed)
Patient came in today for urine pregnancy test. Patient re confirmed in the ipledge system and RX sent in.   Patient is going to try [place] at this time. aw

## 2022-08-22 ENCOUNTER — Ambulatory Visit (INDEPENDENT_AMBULATORY_CARE_PROVIDER_SITE_OTHER): Payer: BC Managed Care – PPO | Admitting: Dermatology

## 2022-08-22 VITALS — Wt 248.0 lb

## 2022-08-22 DIAGNOSIS — L853 Xerosis cutis: Secondary | ICD-10-CM | POA: Diagnosis not present

## 2022-08-22 DIAGNOSIS — L700 Acne vulgaris: Secondary | ICD-10-CM

## 2022-08-22 DIAGNOSIS — K130 Diseases of lips: Secondary | ICD-10-CM

## 2022-08-22 DIAGNOSIS — L732 Hidradenitis suppurativa: Secondary | ICD-10-CM | POA: Diagnosis not present

## 2022-08-22 DIAGNOSIS — Z79899 Other long term (current) drug therapy: Secondary | ICD-10-CM

## 2022-08-22 NOTE — Progress Notes (Unsigned)
Isotretinoin Follow-Up Visit   Subjective  Leslie Berger is a 37 y.o. female who presents for the following: Isotretinoin follow-up for Acne (face) and Hidradenitis Suppurativa (axilla, back, groin)  Week # 8 Pharmacy [facility] iPLEDGE # [id number] Total mg -  2100 Total mg/kg - 18.66 Birth Control- Nexplanon Implant; Female latex condom   Isotretinoin F/U - 08/22/22 1600       Isotretinoin Follow Up   iPledge # [id number]    Date 08/22/22    Weight 248 lb (112.5 kg)    Two Forms of Birth Control Implant;Female Condom    Acne breakouts since last visit? Yes      Dosage   Target Dosage (mg) 22500    Current (To Date) Dosage (mg) 2100    To Go Dosage (mg) 20400      Side Effects   Skin Dry Lips    Gastrointestinal WNL    Neurological WNL    Constitutional WNL               Side effects: Dry skin, dry lips  Patient is not pregnant, not seeking pregnancy, and not breastfeeding.   The following portions of the chart were reviewed this encounter and updated as appropriate: medications, allergies, medical history  Review of Systems:  No other skin or systemic complaints except as noted in HPI or Assessment and Plan.  Objective  Well appearing patient in no apparent distress; mood and affect are within normal limits.  An examination of the face, neck, chest, and back was performed and relevant findings are noted below.     Assessment & Plan   Acne vulgaris  Hidradenitis    ACNE VULGARIS (face) Patient is currently on Isotretinoin requiring FDA mandated monthly evaluations and laboratory monitoring. Condition is currently not to goal (must reach target dose based on weight and also have clear skin for 2 months prior to discontinuation in order to help prevent relapse)  Exam findings: Hyperpigmented macules and papules on the cheeks; closed comedones on the cheeks; 5.26mm firm sq nodule of the R oral commissure.  Increase isotretinoin 30 MG take  2 po QD dsp #60 0Rf.  Urine pregnancy test performed in office 08/23/2022 and was negative.  Patient demonstrates comprehension and confirms she will not get pregnant.   Patient confirmed in iPledge and isotretinoin sent to pharmacy.   HIDRADENITIS SUPPURATIVA (back, axilla, groin) Exam: Draining nodule of the mid back.  Chronic and persistent condition with duration or expected duration over one year. Condition is symptomatic/ bothersome to patient. Not currently at goal.   Hidradenitis Suppurativa is a chronic; persistent; non-curable, but treatable condition due to abnormal inflamed sweat glands in the body folds (axilla, inframammary, groin, medial thighs), causing recurrent painful draining cysts and scarring. It can be associated with severe scarring acne and cysts; also abscesses and scarring of scalp. The goal is control and prevention of flares, as it is not curable. Scars are permanent and can be thickened. Treatment may include daily use of topical medication and oral antibiotics.  Oral isotretinoin may also be helpful.  For some cases, Humira or Cosentyx (biologic injections) may be prescribed to decrease the inflammatory process and prevent flares.  When indicated, inflamed cysts may also be treated surgically.  Patient is currently on Isotretinoin requiring FDA mandated monthly evaluations and laboratory monitoring. Condition is currently not to goal (must reach target dose based on weight and also have clear skin for 2 months prior to discontinuation in order to  help prevent relapse)  Treatment Plan: Increase isotretinoin 30 MG take 2 po QD. Continue Clindamycin lotion QD after shower.  Xerosis secondary to isotretinoin therapy - Continue emollients as directed - Xyzal (levocetirizine) once a day and fish oil 1 gram daily may also help with dryness   Cheilitis secondary to isotretinoin therapy - Continue lip balm as directed, Dr. [name] Cortibalm recommended   Long term  medication management (isotretinoin) - While taking Isotretinoin and for 30 days after you finish the medication, do not get pregnant, do not share pills, do not donate blood. Isotretinoin is best absorbed when taken with a fatty meal. Isotretinoin can make you sensitive to the sun. Daily careful sun protection including sunscreen SPF 30+ when outdoors is recommended.  Follow-up in 30 days.  ICherlyn Labella, CMA, am acting as scribe for Willeen Niece, MD .   Documentation: I have reviewed the above documentation for accuracy and completeness, and I agree with the above.  Willeen Niece, MD

## 2022-08-22 NOTE — Progress Notes (Unsigned)
Isotretinoin Follow-Up Visit   Subjective  Leslie Berger is a 37 y.o. female who presents for the following: Isotretinoin follow-up for Acne (face) and Hidradenitis Suppurativa (axilla, back, groin)  Week # 8 Pharmacy [facility] iPLEDGE # [id number] Total mg -  2100 Total mg/kg - 18.66 Birth Control- Implant; Female latex condom   Isotretinoin F/U - 08/22/22 1600       Isotretinoin Follow Up   iPledge # [id number]    Date 08/22/22    Weight 248 lb (112.5 kg)    Two Forms of Birth Control Implant;Female Condom    Acne breakouts since last visit? Yes      Dosage   Target Dosage (mg) 22500    Current (To Date) Dosage (mg) 2100    To Go Dosage (mg) 20400      Side Effects   Skin Dry Lips    Gastrointestinal WNL    Neurological WNL    Constitutional WNL               Side effects: Dry skin, dry lips  Patient is not pregnant, not seeking pregnancy, and not breastfeeding.   The following portions of the chart were reviewed this encounter and updated as appropriate: medications, allergies, medical history  Review of Systems:  No other skin or systemic complaints except as noted in HPI or Assessment and Plan.  Objective  Well appearing patient in no apparent distress; mood and affect are within normal limits.  An examination of the face, neck, chest, and back was performed and relevant findings are noted below.     Assessment & Plan   HIDRADENITIS SUPPURATIVA Exam: ***  Chronic and persistent condition with duration or expected duration over one year. Condition is symptomatic/ bothersome to patient. Not currently at goal.   Hidradenitis Suppurativa is a chronic; persistent; non-curable, but treatable condition due to abnormal inflamed sweat glands in the body folds (axilla, inframammary, groin, medial thighs), causing recurrent painful draining cysts and scarring. It can be associated with severe scarring acne and cysts; also abscesses and scarring of  scalp. The goal is control and prevention of flares, as it is not curable. Scars are permanent and can be thickened. Treatment may include daily use of topical medication and oral antibiotics.  Oral isotretinoin may also be helpful.  For some cases, Humira or Cosentyx (biologic injections) may be prescribed to decrease the inflammatory process and prevent flares.  When indicated, inflamed cysts may also be treated surgically.  Patient is currently on Isotretinoin requiring FDA mandated monthly evaluations and laboratory monitoring. Condition is currently not to goal (must reach target dose based on weight and also have clear skin for 2 months prior to discontinuation in order to help prevent relapse)  Treatment Plan:    ACNE VULGARIS Patient is currently on Isotretinoin requiring FDA mandated monthly evaluations and laboratory monitoring. Condition is currently not to goal (must reach target dose based on weight and also have clear skin for 2 months prior to discontinuation in order to help prevent relapse)  Exam findings: ***  Continue isotretinoin ***  Urine pregnancy test performed in office today and was negative.  Patient demonstrates comprehension and confirms she will not get pregnant. ***  Patient confirmed in iPledge and isotretinoin sent to pharmacy. ***   Xerosis secondary to isotretinoin therapy - Continue emollients as directed - Xyzal (levocetirizine) once a day and fish oil 1 gram daily may also help with dryness   Cheilitis secondary to isotretinoin therapy -  Continue lip balm as directed, Dr. [name] Cortibalm recommended   Long term medication management (isotretinoin) - While taking Isotretinoin and for 30 days after you finish the medication, do not get pregnant, do not share pills, do not donate blood. Isotretinoin is best absorbed when taken with a fatty meal. Isotretinoin can make you sensitive to the sun. Daily careful sun protection including sunscreen SPF 30+  when outdoors is recommended.  Follow-up in 30 days.  ***  Documentation: I have reviewed the above documentation for accuracy and completeness, and I agree with the above.  Willeen Niece, MD

## 2022-08-22 NOTE — Patient Instructions (Addendum)
Due to recent changes in healthcare laws, you may see results of your pathology and/or laboratory studies on MyChart before the doctors have had a chance to review them. We understand that in some cases there may be results that are confusing or concerning to you. Please understand that not all results are received at the same time and often the doctors may need to interpret multiple results in order to provide you with the best plan of care or course of treatment. Therefore, we ask that you please give us 2 business days to thoroughly review all your results before contacting the office for clarification. Should we see a critical lab result, you will be contacted sooner.   If You Need Anything After Your Visit  If you have any questions or concerns for your doctor, please call our main line at [phone] and press option 4 to reach your doctor's medical assistant. If no one answers, please leave a voicemail as directed and we will return your call as soon as possible. Messages left after 4 pm will be answered the following business day.   You may also send us a message via MyChart. We typically respond to MyChart messages within 1-2 business days.  For prescription refills, please ask your pharmacy to contact our office. Our fax number is [phone].  If you have an urgent issue when the clinic is closed that cannot wait until the next business day, you can page your doctor at the number below.    Please note that while we do our best to be available for urgent issues outside of office hours, we are not available 24/7.   If you have an urgent issue and are unable to reach us, you may choose to seek medical care at your doctor's office, retail clinic, urgent care center, or emergency room.  If you have a medical emergency, please immediately call 911 or go to the emergency department.  Pager Numbers  - Dr. Kowalski: [phone]  - Dr. Moye: [phone]  - Dr. Stewart:  [phone]  In the event of inclement weather, please call our main line at [phone] for an update on the status of any delays or closures.  Dermatology Medication Tips: Please keep the boxes that topical medications come in in order to help keep track of the instructions about where and how to use these. Pharmacies typically print the medication instructions only on the boxes and not directly on the medication tubes.   If your medication is too expensive, please contact our office at [phone] option 4 or send us a message through MyChart.   We are unable to tell what your co-pay for medications will be in advance as this is different depending on your insurance coverage. However, we may be able to find a substitute medication at lower cost or fill out paperwork to get insurance to cover a needed medication.   If a prior authorization is required to get your medication covered by your insurance company, please allow us 1-2 business days to complete this process.  Drug prices often vary depending on where the prescription is filled and some pharmacies may offer cheaper prices.  The website www.goodrx.com contains coupons for medications through different pharmacies. The prices here do not account for what the cost may be with help from insurance (it may be cheaper with your insurance), but the website can give you the price if you did not use any insurance.  - You can print the associated coupon and take it with   your prescription to the pharmacy.  - You may also stop by our office during regular business hours and pick up a GoodRx coupon card.  - If you need your prescription sent electronically to a different pharmacy, notify our office through [facility] MyChart or by phone at [phone] option 4.     Si Usted Necesita Algo Despus de Su Visita  Tambin puede enviarnos un mensaje a travs de MyChart. Por lo general respondemos a los mensajes de MyChart en el transcurso de 1 a 2  das hbiles.  Para renovar recetas, por favor pida a su farmacia que se ponga en contacto con nuestra oficina. Nuestro nmero de fax es el [phone].  Si tiene un asunto urgente cuando la clnica est cerrada y que no puede esperar hasta el siguiente da hbil, puede llamar/localizar a su doctor(a) al nmero que aparece a continuacin.   Por favor, tenga en cuenta que aunque hacemos todo lo posible para estar disponibles para asuntos urgentes fuera del horario de oficina, no estamos disponibles las 24 horas del da, los 7 das de la semana.   Si tiene un problema urgente y no puede comunicarse con nosotros, puede optar por buscar atencin mdica  en el consultorio de su doctor(a), en una clnica privada, en un centro de atencin urgente o en una sala de emergencias.  Si tiene una emergencia mdica, por favor llame inmediatamente al 911 o vaya a la sala de emergencias.  Nmeros de bper  - Dr. Kowalski: [phone]  - Dra. Moye: [phone]  - Dra. Stewart: [phone]  En caso de inclemencias del tiempo, por favor llame a nuestra lnea principal al [phone] para una actualizacin sobre el estado de cualquier retraso o cierre.  Consejos para la medicacin en dermatologa: Por favor, guarde las cajas en las que vienen los medicamentos de uso tpico para ayudarle a seguir las instrucciones sobre dnde y cmo usarlos. Las farmacias generalmente imprimen las instrucciones del medicamento slo en las cajas y no directamente en los tubos del medicamento.   Si su medicamento es muy caro, por favor, pngase en contacto con nuestra oficina llamando al [phone] y presione la opcin 4 o envenos un mensaje a travs de MyChart.   No podemos decirle cul ser su copago por los medicamentos por adelantado ya que esto es diferente dependiendo de la cobertura de su seguro. Sin embargo, es posible que podamos encontrar un medicamento sustituto a menor costo o llenar un formulario para que el  seguro cubra el medicamento que se considera necesario.   Si se requiere una autorizacin previa para que su compaa de seguros cubra su medicamento, por favor permtanos de 1 a 2 das hbiles para completar este proceso.  Los precios de los medicamentos varan con frecuencia dependiendo del lugar de dnde se surte la receta y alguna farmacias pueden ofrecer precios ms baratos.  El sitio web www.goodrx.com tiene cupones para medicamentos de diferentes farmacias. Los precios aqu no tienen en cuenta lo que podra costar con la ayuda del seguro (puede ser ms barato con su seguro), pero el sitio web puede darle el precio si no utiliz ningn seguro.  - Puede imprimir el cupn correspondiente y llevarlo con su receta a la farmacia.  - Tambin puede pasar por nuestra oficina durante el horario de atencin regular y recoger una tarjeta de cupones de GoodRx.  - Si necesita que su receta se enve electrnicamente a una farmacia diferente, informe a nuestra oficina a travs de MyChart de [place]   o por telfono llamando al [phone] y presione la opcin 4.  

## 2022-08-23 MED ORDER — ISOTRETINOIN 30 MG PO CAPS: 30.0000 mg | ORAL_CAPSULE | Freq: Two times a day (BID) | ORAL | 0 refills | Status: DC

## 2022-09-27 ENCOUNTER — Ambulatory Visit (INDEPENDENT_AMBULATORY_CARE_PROVIDER_SITE_OTHER): Payer: BC Managed Care – PPO | Admitting: Dermatology

## 2022-09-27 VITALS — Wt 248.0 lb

## 2022-09-27 DIAGNOSIS — K130 Diseases of lips: Secondary | ICD-10-CM

## 2022-09-27 DIAGNOSIS — Z79899 Other long term (current) drug therapy: Secondary | ICD-10-CM

## 2022-09-27 DIAGNOSIS — L700 Acne vulgaris: Secondary | ICD-10-CM

## 2022-09-27 DIAGNOSIS — L853 Xerosis cutis: Secondary | ICD-10-CM

## 2022-09-27 DIAGNOSIS — L732 Hidradenitis suppurativa: Secondary | ICD-10-CM

## 2022-09-27 MED ORDER — ISOTRETINOIN 40 MG PO CAPS: 80.0000 mg | ORAL_CAPSULE | Freq: Every day | ORAL | 0 refills | Status: AC

## 2022-09-27 NOTE — Progress Notes (Signed)
Isotretinoin Follow-Up Visit   Subjective  Leslie Berger is a 37 y.o. female who presents for the following: Isotretinoin follow-up  Week # 12 Pharmacy [facility] iPLEDGE # [id number]  Total mg -  3900 Total mg/kg - 34.66 Birth Control- Implant; Female Condom    Isotretinoin F/U - 09/27/22 1400       Isotretinoin Follow Up   iPledge # [id number]    Date 09/27/22    Weight 248 lb (112.5 kg)    Two Forms of Birth Control Implant;Female Condom    Acne breakouts since last visit? Yes      Dosage   Target Dosage (mg) 22500    Current (To Date) Dosage (mg) 3900    To Go Dosage (mg) 18600      Side Effects   Skin Dry Lips    Gastrointestinal WNL    Neurological WNL    Constitutional WNL              Side effects: Dry skin, dry lips  Patient is not pregnant, not seeking pregnancy, and not breastfeeding.   The following portions of the chart were reviewed this encounter and updated as appropriate: medications, allergies, medical history  Review of Systems:  No other skin or systemic complaints except as noted in HPI or Assessment and Plan.  Objective  Well appearing patient in no apparent distress; mood and affect are within normal limits.  An examination of the face, neck, chest, and back was performed and relevant findings are noted below.     Assessment & Plan     ACNE VULGARIS Patient is currently on Isotretinoin requiring FDA mandated monthly evaluations and laboratory monitoring. Condition is currently not to goal (must reach target dose based on weight and also have clear skin for 2 months prior to discontinuation in order to help prevent relapse)  Exam findings: Cystic nodule at the left upper nasolabial crease/fold; hyperpigmented macules on the cheeks.  Treatment Plan: Increase isotretinoin 40 MG take 2 po QD dsp #60 0Rf. Plan labs on follow-up.  Urine pregnancy test performed in office today and was negative.  Patient demonstrates  comprehension and confirms she will not get pregnant.   Patient confirmed in iPledge and isotretinoin sent to pharmacy.    HIDRADENITIS SUPPURATIVA Exam: nodules with scarring of the bilateral axilla; healing ulceration of the mid spinal lower back.  Chronic and persistent condition with duration or expected duration over one year. Condition is symptomatic/ bothersome to patient. Not currently at goal, but improving. No new flares.  Hidradenitis Suppurativa is a chronic; persistent; non-curable, but treatable condition due to abnormal inflamed sweat glands in the body folds (axilla, inframammary, groin, medial thighs), causing recurrent painful draining cysts and scarring. It can be associated with severe scarring acne and cysts; also abscesses and scarring of scalp. The goal is control and prevention of flares, as it is not curable. Scars are permanent and can be thickened. Treatment may include daily use of topical medication and oral antibiotics.  Oral isotretinoin may also be helpful.  For some cases, Humira or Cosentyx (biologic injections) may be prescribed to decrease the inflammatory process and prevent flares.  When indicated, inflamed cysts may also be treated surgically.  Treatment Plan: Increase isotretinoin 40 MG take 2 po QD. Continue Clindamycin lotion QD after shower.  Xerosis secondary to isotretinoin therapy - Continue emollients as directed - Xyzal (levocetirizine) once a day and fish oil 1 gram daily may also help with dryness   Cheilitis  secondary to isotretinoin therapy - Continue lip balm as directed, Dr. [name] Cortibalm recommended   Long term medication management (isotretinoin) - While taking Isotretinoin and for 30 days after you finish the medication, do not get pregnant, do not share pills, do not donate blood. Isotretinoin is best absorbed when taken with a fatty meal. Isotretinoin can make you sensitive to the sun. Daily careful sun protection including  sunscreen SPF 30+ when outdoors is recommended.  Follow-up in 30 days.  ICherlyn Labella, CMA, am acting as scribe for Willeen Niece, MD .   Documentation: I have reviewed the above documentation for accuracy and completeness, and I agree with the above.  Willeen Niece, MD

## 2022-09-27 NOTE — Patient Instructions (Signed)
Due to recent changes in healthcare laws, you may see results of your pathology and/or laboratory studies on MyChart before the doctors have had a chance to review them. We understand that in some cases there may be results that are confusing or concerning to you. Please understand that not all results are received at the same time and often the doctors may need to interpret multiple results in order to provide you with the best plan of care or course of treatment. Therefore, we ask that you please give us 2 business days to thoroughly review all your results before contacting the office for clarification. Should we see a critical lab result, you will be contacted sooner.   If You Need Anything After Your Visit  If you have any questions or concerns for your doctor, please call our main line at [phone] and press option 4 to reach your doctor's medical assistant. If no one answers, please leave a voicemail as directed and we will return your call as soon as possible. Messages left after 4 pm will be answered the following business day.   You may also send us a message via MyChart. We typically respond to MyChart messages within 1-2 business days.  For prescription refills, please ask your pharmacy to contact our office. Our fax number is [phone].  If you have an urgent issue when the clinic is closed that cannot wait until the next business day, you can page your doctor at the number below.    Please note that while we do our best to be available for urgent issues outside of office hours, we are not available 24/7.   If you have an urgent issue and are unable to reach us, you may choose to seek medical care at your doctor's office, retail clinic, urgent care center, or emergency room.  If you have a medical emergency, please immediately call 911 or go to the emergency department.  Pager Numbers  - Dr. Kowalski: [phone]  - Dr. Moye: [phone]  - Dr. Stewart:  [phone]  In the event of inclement weather, please call our main line at [phone] for an update on the status of any delays or closures.  Dermatology Medication Tips: Please keep the boxes that topical medications come in in order to help keep track of the instructions about where and how to use these. Pharmacies typically print the medication instructions only on the boxes and not directly on the medication tubes.   If your medication is too expensive, please contact our office at [phone] option 4 or send us a message through MyChart.   We are unable to tell what your co-pay for medications will be in advance as this is different depending on your insurance coverage. However, we may be able to find a substitute medication at lower cost or fill out paperwork to get insurance to cover a needed medication.   If a prior authorization is required to get your medication covered by your insurance company, please allow us 1-2 business days to complete this process.  Drug prices often vary depending on where the prescription is filled and some pharmacies may offer cheaper prices.  The website www.goodrx.com contains coupons for medications through different pharmacies. The prices here do not account for what the cost may be with help from insurance (it may be cheaper with your insurance), but the website can give you the price if you did not use any insurance.  - You can print the associated coupon and take it with   your prescription to the pharmacy.  - You may also stop by our office during regular business hours and pick up a GoodRx coupon card.  - If you need your prescription sent electronically to a different pharmacy, notify our office through [facility] MyChart or by phone at [phone] option 4.     Si Usted Necesita Algo Despus de Su Visita  Tambin puede enviarnos un mensaje a travs de MyChart. Por lo general respondemos a los mensajes de MyChart en el transcurso de 1 a 2  das hbiles.  Para renovar recetas, por favor pida a su farmacia que se ponga en contacto con nuestra oficina. Nuestro nmero de fax es el [phone].  Si tiene un asunto urgente cuando la clnica est cerrada y que no puede esperar hasta el siguiente da hbil, puede llamar/localizar a su doctor(a) al nmero que aparece a continuacin.   Por favor, tenga en cuenta que aunque hacemos todo lo posible para estar disponibles para asuntos urgentes fuera del horario de oficina, no estamos disponibles las 24 horas del da, los 7 das de la semana.   Si tiene un problema urgente y no puede comunicarse con nosotros, puede optar por buscar atencin mdica  en el consultorio de su doctor(a), en una clnica privada, en un centro de atencin urgente o en una sala de emergencias.  Si tiene una emergencia mdica, por favor llame inmediatamente al 911 o vaya a la sala de emergencias.  Nmeros de bper  - Dr. Kowalski: [phone]  - Dra. Moye: [phone]  - Dra. Stewart: [phone]  En caso de inclemencias del tiempo, por favor llame a nuestra lnea principal al [phone] para una actualizacin sobre el estado de cualquier retraso o cierre.  Consejos para la medicacin en dermatologa: Por favor, guarde las cajas en las que vienen los medicamentos de uso tpico para ayudarle a seguir las instrucciones sobre dnde y cmo usarlos. Las farmacias generalmente imprimen las instrucciones del medicamento slo en las cajas y no directamente en los tubos del medicamento.   Si su medicamento es muy caro, por favor, pngase en contacto con nuestra oficina llamando al [phone] y presione la opcin 4 o envenos un mensaje a travs de MyChart.   No podemos decirle cul ser su copago por los medicamentos por adelantado ya que esto es diferente dependiendo de la cobertura de su seguro. Sin embargo, es posible que podamos encontrar un medicamento sustituto a menor costo o llenar un formulario para que el  seguro cubra el medicamento que se considera necesario.   Si se requiere una autorizacin previa para que su compaa de seguros cubra su medicamento, por favor permtanos de 1 a 2 das hbiles para completar este proceso.  Los precios de los medicamentos varan con frecuencia dependiendo del lugar de dnde se surte la receta y alguna farmacias pueden ofrecer precios ms baratos.  El sitio web www.goodrx.com tiene cupones para medicamentos de diferentes farmacias. Los precios aqu no tienen en cuenta lo que podra costar con la ayuda del seguro (puede ser ms barato con su seguro), pero el sitio web puede darle el precio si no utiliz ningn seguro.  - Puede imprimir el cupn correspondiente y llevarlo con su receta a la farmacia.  - Tambin puede pasar por nuestra oficina durante el horario de atencin regular y recoger una tarjeta de cupones de GoodRx.  - Si necesita que su receta se enve electrnicamente a una farmacia diferente, informe a nuestra oficina a travs de MyChart de [place]   o por telfono llamando al [phone] y presione la opcin 4.  

## 2022-10-26 ENCOUNTER — Ambulatory Visit: Payer: BC Managed Care – PPO | Admitting: Dermatology

## 2022-12-26 ENCOUNTER — Telehealth: Payer: Self-pay

## 2022-12-26 NOTE — Telephone Encounter (Signed)
Pt calling; has question about her birth control.  [phone]  Pt has nexplanon; wants to know when it's time to change it.  Adv 03/2023; sched not out yet; to call end of Sept to see if can sched an appt.

## 2023-03-06 ENCOUNTER — Ambulatory Visit (INDEPENDENT_AMBULATORY_CARE_PROVIDER_SITE_OTHER): Payer: BC Managed Care – PPO | Admitting: Obstetrics and Gynecology

## 2023-03-06 ENCOUNTER — Encounter: Payer: Self-pay | Admitting: Obstetrics and Gynecology

## 2023-03-06 VITALS — BP 134/81 | HR 107 | Temp 98.8°F | Ht 64.0 in | Wt 280.0 lb

## 2023-03-06 DIAGNOSIS — Z3046 Encounter for surveillance of implantable subdermal contraceptive: Secondary | ICD-10-CM

## 2023-03-06 MED ORDER — ETONOGESTREL 68 MG ~~LOC~~ IMPL
68.0000 mg | DRUG_IMPLANT | Freq: Once | SUBCUTANEOUS | Status: AC
  Administered 2023-03-06: 68 mg via SUBCUTANEOUS

## 2023-03-06 NOTE — Patient Instructions (Addendum)
I value your feedback and you entrusting [place] with your care. If you get a [facility] patient survey, I would appreciate you taking the time to let us know about your experience today. Thank you!  Remove the dressing in 24 hours,  keep the incision area dry for 24 hours and remove the Steristrip in 2-3  days.  Notify us if any signs of tenderness, redness, pain, or fevers develop.

## 2023-03-06 NOTE — Progress Notes (Signed)
   Chief Complaint  Patient presents with   Contraception    Nexplanon replacement     HPI:  Leslie Berger is a 37 y.o. G0P0000 here for Nexplanon removal and insertion. Nexplanon placed 03/18/20; would like another one. Having monthly periods, lasting 7 days, light flow, no BTB, mild dsymen, improved with NSAIDs.  BP 134/81   Pulse (!) 107   Temp 98.8 F (37.1 C)   Ht 5\' 4"  (1.626 m)   Wt 280 lb (127 kg)   LMP 02/14/2023 (Approximate)   BMI 48.06 kg/m    Nexplanon removal Procedure note - The Nexplanon was noted in the patient's arm and the end was identified. The skin was cleansed with a Betadine solution. A small injection of subcutaneous lidocaine with epinephrine was given over the end of the implant. An incision was made at the end of the implant. The rod was noted in the incision and grasped with a hemostat. It was noted to be intact.  Steri-Strip was placed approximating the incision. Hemostasis was noted.  Nexplanon Insertion  Patient given informed consent, signed copy in the chart, time out was performed.  Appropriate time out taken.  Patient's LEFT arm was prepped and draped in the usual sterile fashion. The ruler used to measure and mark insertion area.  Pt was prepped with betadine swab and then injected with 1.0 cc of 2% lidocaine with epinephrine. Nexplanon removed form packaging,  Device confirmed in needle, then inserted full length of needle and withdrawn per handbook instructions.  Pt insertion site covered with steri-strip and a bandage.   Minimal blood loss.  Pt tolerated the procedure well.  Assessment: Encounter for removal and reinsertion of Nexplanon - Plan: etonogestrel (NEXPLANON) implant 68 mg   Meds ordered this encounter  Medications   etonogestrel (NEXPLANON) implant 68 mg     Plan:   She was told to remove the dressing in 12-24 hours, to keep the incision area dry for 24 hours and to remove the Steristrip in 2-3  days.  Notify us if any  signs of tenderness, redness, pain, or fevers develop.  Return in about 1 month (around 04/05/2023) for annual.  [name] B. Wille Aubuchon, PA-C 03/06/2023 3:54 PM

## 2023-03-20 ENCOUNTER — Telehealth: Payer: Self-pay

## 2023-03-20 NOTE — Telephone Encounter (Signed)
Patient advised she had nexplanon removed/replaced 03/06/23. She has been having pain and soreness in the arm since them that has been unrelieved by ibuprofen. She states she can hardly lift her arm.

## 2023-03-20 NOTE — Telephone Encounter (Signed)
Patient aware. Helmut Muster is not back in the office again until Thursday and her schedule is full. Advised per Helmut Muster we can call her back if we have a cancellation. Patient prefers to be seen. Advised her whole arm hurts. Scheduled appointment for 1:15 tomorrow with Autumn Messing.

## 2023-03-20 NOTE — Telephone Encounter (Signed)
Psl schedule appt. Any signs of infection? Try ice pack on top of shirt in meantime.

## 2023-03-21 ENCOUNTER — Ambulatory Visit: Payer: BC Managed Care – PPO

## 2023-03-21 VITALS — BP 145/97 | HR 87 | Temp 98.1°F | Ht 64.0 in | Wt 277.0 lb

## 2023-03-21 DIAGNOSIS — G8918 Other acute postprocedural pain: Secondary | ICD-10-CM

## 2023-03-21 DIAGNOSIS — Z3046 Encounter for surveillance of implantable subdermal contraceptive: Secondary | ICD-10-CM

## 2023-03-21 NOTE — Progress Notes (Signed)
   GYN ENCOUNTER  Encounter for Nexplanon follow up  Subjective  HPI: Leslie Berger is a 37 y.o. G0P0000 who presents today for pain in arm following Nexplanon placement.  Nexplanon removal and reinsertion on 03/06/23.   Since that time has experienced ongoing pain and soreness that has not been fully relieved with Tylenol and ibuprofen. She also tried ice yesterday without much relief.    Past Medical History:  Diagnosis Date   Acid reflux    Diabetes mellitus without complication (HCC)    History of weight loss surgery 12/26/2017   Hypertension    Past Surgical History:  Procedure Laterality Date   CYST EXCISION     LAPAROTOMY N/A 07/17/2015   Procedure: LAPAROTOMY;  Surgeon: Nadara Mustard, MD;  Location: [facility];  Service: Gynecology;  Laterality: N/A;   OVARIAN CYST REMOVAL Bilateral 07/17/2015   Procedure: OVARIAN CYSTECTOMY;  Surgeon: Nadara Mustard, MD;  Location: [facility];  Service: Gynecology;  Laterality: Bilateral;   OVARIAN CYST REMOVAL     OB History     Gravida  0   Para  0   Term  0   Preterm  0   AB  0   Living  0      SAB  0   IAB  0   Ectopic  0   Multiple  0   Live Births  0          No Known Allergies  Review of Systems  12 point ROS negative except for pertinent positives noted in HPO above.   Objective  BP (!) 145/97   Pulse 87   Temp 98.1 F (36.7 C)   Ht 5\' 4"  (1.626 m)   Wt 277 lb (125.6 kg)   LMP 02/14/2023 (Approximate)   BMI 47.55 kg/m   Physical examination GENERAL APPEARANCE: alert, well appearing LUNGS: normal work of breathing HEART: normal pulse EXTREMITIES: incision site on left arm approximately 8 cm from elbow, intact and healing well, no signs of infection, implant palpable just below skin. Equal bilateral grip strength and radial pulses.  Assessment/Plan 1) Provided reassurance that insertion site is healing well with no signs of infection. Nexplanon in correct location, no concern for nerve  or muscle damage. 2) Reviewed that healing may take longer due to removal and reinsertion at the same time. Recommended continued use of Tylenol, ibuprofen, and ice as needed. Patient expressed understanding of recommendations. Will follow up in 1 month if pain has not resolved.  3) Recommended follow up with PCP for elevated blood pressure.  Lindalou Hose Jayona Mccaig, CNM  03/21/23 1:36 PM

## 2023-04-04 NOTE — Progress Notes (Signed)
PCP:  [facility]   Chief Complaint  Patient presents with   Gynecologic Exam    No concerns.      HPI:      [name] is a 37 y.o. G0P0000 whose LMP was Patient's last menstrual period was 02/14/2023 (approximate)., presents today for her annual examination.  Her menses are absent since nexplanon replacement 11/24. Had been monthly, lasting 7 days.  Sex activity: currently sexually active - contraception--Nexplanon REplaced 03/06/23. Is having pain LT arm since replacement. Has pain in dorsal aspect of LT lower arm, sometimes causing tingling in her LT hand. No pain in wrist, no overuse on computer. Also with some shoulder pain. Insertion site not painful.  Last Pap: 05/05/19 Results were: no abnormalities /neg HPV DNA; no hx of abn paps  There is no FH of breast cancer. There is no FH of ovarian cancer. The patient does not do self-breast exams.  Tobacco use: The patient denies current or previous tobacco use. Alcohol use: none No drug use.  Exercise: moderately active  She does get adequate calcium and Vitamin D in her diet. Labs with PCP  Patient Active Problem List   Diagnosis Date Noted   History of weight loss surgery 12/26/2017   Class 3 obesity with serious comorbidity and body mass index (BMI) of 50.0 to 59.9 in adult 08/07/2016   Dermoid cyst of both ovaries 07/17/2015   Hypertension 06/05/2014   Diabetes mellitus type I (HCC) 06/05/2014    Past Surgical History:  Procedure Laterality Date   BARIATRIC SURGERY  12/24/2017   CYST EXCISION     LAPAROTOMY N/A 07/17/2015   Procedure: LAPAROTOMY;  Surgeon: Nadara Mustard, MD;  Location: [facility];  Service: Gynecology;  Laterality: N/A;   OVARIAN CYST REMOVAL Bilateral 07/17/2015   Procedure: OVARIAN CYSTECTOMY;  Surgeon: Nadara Mustard, MD;  Location: [facility];  Service: Gynecology;  Laterality: Bilateral;   OVARIAN CYST REMOVAL      Family History  Problem Relation Age of Onset    Hypertension Mother    Diabetes Mother    Hyperlipidemia Maternal Grandfather    Hypertension Maternal Grandfather    Hypertension Paternal Grandmother     Social History   Socioeconomic History   Marital status: Single    Spouse name: Not on file   Number of children: Not on file   Years of education: Not on file   Highest education level: Not on file  Occupational History   Not on file  Tobacco Use   Smoking status: Never   Smokeless tobacco: Never  Vaping Use   Vaping status: Never Used  Substance and Sexual Activity   Alcohol use: Yes    Comment: soc   Drug use: No   Sexual activity: Yes    Birth control/protection: Implant  Other Topics Concern   Not on file  Social History Narrative   Not on file   Social Determinants of Health   Financial Resource Strain: Not on file  Food Insecurity: Not on file  Transportation Needs: Not on file  Physical Activity: Not on file  Stress: Not on file  Social Connections: Not on file  Intimate Partner Violence: Not on file     Current Outpatient Medications:    Adapalene (DIFFERIN) 0.3 % gel, APPLY TOPICALLY TO AFFECTED AREA (FACE) AT BEDTIME FOR ACNE, Disp: , Rfl:    albuterol (VENTOLIN HFA) 108 (90 Base) MCG/ACT inhaler, Inhale 1-2 puffs into the lungs every 6 (  six) hours as needed for wheezing or shortness of breath., Disp: 18 g, Rfl: 0   buPROPion (WELLBUTRIN XL) 300 MG 24 hr tablet, Take 300 mg by mouth daily., Disp: , Rfl:    cetirizine (ZYRTEC) 10 MG tablet, cetirizine 10 mg tablet, Disp: , Rfl:    clindamycin (CLEOCIN T) 1 % lotion, Apply topically as directed. Qd to aa axilla, back after shower, Disp: 60 mL, Rfl: 6   clindamycin (CLEOCIN T) 1 % SWAB, Apply 1 each topically 2 (two) times daily., Disp: , Rfl:    doxycycline (VIBRAMYCIN) 100 MG capsule, Take 100 mg by mouth 2 (two) times daily., Disp: , Rfl:    etonogestrel (NEXPLANON) 68 MG IMPL implant, 1 each by Subdermal route once., Disp: , Rfl:    fluticasone  (FLONASE) 50 MCG/ACT nasal spray, Place into the nose., Disp: , Rfl:    ibuprofen (ADVIL) 800 MG tablet, , Disp: , Rfl:    ipratropium (ATROVENT) 0.03 % nasal spray, ipratropium bromide 21 mcg (0.03 %) nasal spray  USE 2 SPRAY(S) IN EACH NOSTRIL TWICE DAILY AS NEEDED FOR RUNNY NOSE, Disp: , Rfl:    naproxen (NAPROSYN) 500 MG tablet, Take 1 tablet by mouth 2 (two) times daily as needed., Disp: , Rfl:    phentermine (ADIPEX-P) 37.5 MG tablet, Take by mouth., Disp: , Rfl:    polyethylene glycol powder (GLYCOLAX/MIRALAX) 17 GM/SCOOP powder, Take by mouth., Disp: , Rfl:      ROS:  Review of Systems  Constitutional:  Negative for fatigue, fever and unexpected weight change.  Respiratory:  Negative for cough, shortness of breath and wheezing.   Cardiovascular:  Negative for chest pain, palpitations and leg swelling.  Gastrointestinal:  Negative for blood in stool, constipation, diarrhea, nausea and vomiting.  Endocrine: Negative for cold intolerance, heat intolerance and polyuria.  Genitourinary:  Negative for dyspareunia, dysuria, flank pain, frequency, genital sores, hematuria, menstrual problem, pelvic pain, urgency, vaginal bleeding, vaginal discharge and vaginal pain.  Musculoskeletal:  Negative for back pain, joint swelling and myalgias.  Skin:  Negative for rash.  Neurological:  Negative for dizziness, syncope, light-headedness, numbness and headaches.  Hematological:  Negative for adenopathy.  Psychiatric/Behavioral:  Negative for agitation, confusion, sleep disturbance and suicidal ideas. The patient is not nervous/anxious.    BREAST: No symptoms   Objective: BP 117/78   Pulse 96   Ht 5\' 4"  (1.626 m)   Wt 278 lb (126.1 kg)   LMP 02/14/2023 (Approximate)   BMI 47.72 kg/m    Physical Exam Constitutional:      Appearance: She is well-developed.  Genitourinary:     Vulva normal.     Right Labia: No rash, tenderness or lesions.    Left Labia: No tenderness, lesions or rash.     No vaginal discharge, erythema or tenderness.      Right Adnexa: not tender and no mass present.    Left Adnexa: not tender and no mass present.    No cervical friability or polyp.     Uterus is not enlarged or tender.  Breasts:    Right: No mass, nipple discharge, skin change or tenderness.     Left: No mass, nipple discharge, skin change or tenderness.  Neck:     Thyroid: No thyromegaly.  Cardiovascular:     Rate and Rhythm: Normal rate and regular rhythm.     Heart sounds: Normal heart sounds. No murmur heard. Pulmonary:     Effort: Pulmonary effort is normal.  Breath sounds: Normal breath sounds.  Abdominal:     Palpations: Abdomen is soft.     Tenderness: There is no abdominal tenderness. There is no guarding or rebound.  Musculoskeletal:        General: Normal range of motion.     Cervical back: Normal range of motion.     Comments: NEG LT ARM, SHOULDER, HAND EXAM  Lymphadenopathy:     Cervical: No cervical adenopathy.  Neurological:     General: No focal deficit present.     Mental Status: She is alert and oriented to person, place, and time.     Cranial Nerves: No cranial nerve deficit.  Skin:    General: Skin is warm and dry.  Psychiatric:        Mood and Affect: Mood normal.        Behavior: Behavior normal.        Thought Content: Thought content normal.        Judgment: Judgment normal.  Vitals reviewed.     Assessment/Plan: Encounter for annual routine gynecological examination  Cervical cancer screening - Plan: Cytology - PAP  Screening for HPV (human papillomavirus) - Plan: Cytology - PAP  Encounter for surveillance of implantable subdermal contraceptive; replaced 11/24. Pt with LT arm pain since replacement.  Neg exam at insertion site. Nexplanon in superficial tissue, not on any nerves. Most likely not related given area of pain too. Has appt with PCP next wk.    GYN counsel adequate intake of calcium and vitamin D, diet and exercise      F/U  Return in about 1 year (around 04/04/2024).  Mailyn Steichen B. Shaheen Star, PA-C 04/05/2023 1:40 PM

## 2023-04-05 ENCOUNTER — Other Ambulatory Visit (HOSPITAL_COMMUNITY)
Admission: RE | Admit: 2023-04-05 | Discharge: 2023-04-05 | Disposition: A | Discharge status: Home / Self Care | Payer: BC Managed Care – PPO | Source: Ambulatory Visit | Attending: Obstetrics and Gynecology | Admitting: Obstetrics and Gynecology

## 2023-04-05 ENCOUNTER — Ambulatory Visit (INDEPENDENT_AMBULATORY_CARE_PROVIDER_SITE_OTHER): Payer: BC Managed Care – PPO | Admitting: Obstetrics and Gynecology

## 2023-04-05 ENCOUNTER — Encounter: Payer: Self-pay | Admitting: Obstetrics and Gynecology

## 2023-04-05 VITALS — BP 117/78 | HR 96 | Ht 64.0 in | Wt 278.0 lb

## 2023-04-05 DIAGNOSIS — Z01419 Encounter for gynecological examination (general) (routine) without abnormal findings: Secondary | ICD-10-CM

## 2023-04-05 DIAGNOSIS — Z1151 Encounter for screening for human papillomavirus (HPV): Secondary | ICD-10-CM | POA: Insufficient documentation

## 2023-04-05 DIAGNOSIS — Z3046 Encounter for surveillance of implantable subdermal contraceptive: Secondary | ICD-10-CM

## 2023-04-05 DIAGNOSIS — Z124 Encounter for screening for malignant neoplasm of cervix: Secondary | ICD-10-CM | POA: Insufficient documentation

## 2023-04-05 NOTE — Patient Instructions (Signed)
I value your feedback and you entrusting us with your care. If you get a [facility] patient survey, I would appreciate you taking the time to let us know about your experience today. Thank you! ? ? ?

## 2023-04-06 ENCOUNTER — Encounter: Payer: Self-pay | Admitting: Obstetrics and Gynecology

## 2023-04-09 LAB — CYTOLOGY - PAP
Adequacy: ABSENT
Comment: NEGATIVE
Diagnosis: NEGATIVE
High risk HPV: NEGATIVE

## 2023-09-16 ENCOUNTER — Other Ambulatory Visit: Payer: Self-pay | Admitting: Dermatology

## 2023-09-16 DIAGNOSIS — L732 Hidradenitis suppurativa: Secondary | ICD-10-CM

## 2023-10-16 ENCOUNTER — Ambulatory Visit (INDEPENDENT_AMBULATORY_CARE_PROVIDER_SITE_OTHER): Admitting: Dermatology

## 2023-10-16 ENCOUNTER — Ambulatory Visit: Admitting: Dermatology

## 2023-10-16 ENCOUNTER — Encounter: Payer: Self-pay | Admitting: Dermatology

## 2023-10-16 ENCOUNTER — Other Ambulatory Visit: Payer: Self-pay

## 2023-10-16 DIAGNOSIS — J340 Abscess, furuncle and carbuncle of nose: Secondary | ICD-10-CM | POA: Diagnosis not present

## 2023-10-16 DIAGNOSIS — L0291 Cutaneous abscess, unspecified: Secondary | ICD-10-CM

## 2023-10-16 MED ORDER — DOXYCYCLINE MONOHYDRATE 100 MG PO CAPS: 100.0000 mg | ORAL_CAPSULE | Freq: Two times a day (BID) | ORAL | 0 refills | Status: AC

## 2023-10-16 NOTE — Progress Notes (Signed)
 Follow-Up Visit   Subjective  Leslie Berger is a 38 y.o. female who presents for the following: bump at left nose that is painful and itchy. Present for a few months, went to PCP 6/2 and patient was put on Bactrim  160 mg BID x 10 days for possible MRSA and told to go to ER if it came back. Patient advises area started to come back last Saturday and she made appt at our office instead of going to ER. Culture was not done with PCP. Patient advises that it was really swollen yesterday and had some drainage last night and this am. Also with a papule at left shoulder that came up yesterday.  Patient previously took isotretinoin  for HS/acne prescribed by Dr. Annette Barters and was lost to follow up.  The patient has spots, moles and lesions to be evaluated, some may be new or changing and the patient may have concern these could be cancer.   The following portions of the chart were reviewed this encounter and updated as appropriate: medications, allergies, medical history  Review of Systems:  No other skin or systemic complaints except as noted in HPI or Assessment and Plan.  Objective  Well appearing patient in no apparent distress; mood and affect are within normal limits.   A focused examination was performed of the following areas: face  Relevant exam findings are noted in the Assessment and Plan.  left paranasal Multi-centimeter subcutaneous nodular induration with focal hemorrhagic crusting and purulent drainage.  Assessment & Plan    ABSCESS left paranasal Differential includes abscess vs inflamed/infected acne cyst. Given frequent recurrence, the cyst would need excision if present.  Discussed risk of intracerebral abscess and thrombosis with infections within the danger triangle on the face. If headache, fever, altered mental status occur, patient should go to ED. Patient has others at home who can monitor. Angular artery is also in this territory so a deep incision without  visualization is too risky.  Culture performed today. Will send urgent referral to ENT for evaluation and I&D vs excision.  Start doxycycline  100 mg bid with food x 10 days.  Doxycycline  should be taken with food to prevent nausea. Do not lay down for 30 minutes after taking. Be cautious with sun exposure and use good sun protection while on this medication. Pregnant women should not take this medication.   Incision and Drainage - left paranasal Location: left paranasal  Informed Consent: Discussed risks (permanent scarring, light or dark discoloration, infection, pain, bleeding, bruising, redness, damage to adjacent structures, and recurrence of the lesion) and benefits of the procedure, as well as the alternatives.  Informed consent was obtained.  Preparation: The area was prepped with alcohol.  Anesthesia: Lidocaine  1% with epinephrine  Procedure Details: An incision was made overlying the lesion. The lesion drained blood but not pus  Ointment and a sterile pressure dressing were applied. The patient tolerated procedure well.  Total number of lesions drained: 1  Plan: The patient was instructed on post-op care. Recommend OTC analgesia as needed for pain.  Related Procedures Anaerobic and Aerobic Culture Related Medications doxycycline  (MONODOX ) 100 MG capsule Take 1 capsule (100 mg total) by mouth 2 (two) times daily for 10 days. Take with food  Return if symptoms worsen or fail to improve.  Kerstin Peeling, RMA, am acting as scribe for Harris Liming, MD .   Documentation: I have reviewed the above documentation for accuracy and completeness, and I agree with the above.  Harris Liming, MD

## 2023-10-16 NOTE — Patient Instructions (Addendum)
 Start doxycycline  100 mg twice daily with food x 10 days.  Doxycycline  should be taken with food to prevent nausea. Do not lay down for 30 minutes after taking. Be cautious with sun exposure and use good sun protection while on this medication. Pregnant women should not take this medication.   Due to recent changes in healthcare laws, you may see results of your pathology and/or laboratory studies on MyChart before the doctors have had a chance to review them. We understand that in some cases there may be results that are confusing or concerning to you. Please understand that not all results are received at the same time and often the doctors may need to interpret multiple results in order to provide you with the best plan of care or course of treatment. Therefore, we ask that you please give us  2 business days to thoroughly review all your results before contacting the office for clarification. Should we see a critical lab result, you will be contacted sooner.   If You Need Anything After Your Visit  If you have any questions or concerns for your doctor, please call our main line at [phone] and press option 4 to reach your doctor's medical assistant. If no one answers, please leave a voicemail as directed and we will return your call as soon as possible. Messages left after 4 pm will be answered the following business day.   You may also send us  a message via MyChart. We typically respond to MyChart messages within 1-2 business days.  For prescription refills, please ask your pharmacy to contact our office. Our fax number is [phone].  If you have an urgent issue when the clinic is closed that cannot wait until the next business day, you can page your doctor at the number below.    Please note that while we do our best to be available for urgent issues outside of office hours, we are not available 24/7.   If you have an urgent issue and are unable to reach us , you may choose to seek  medical care at your doctor's office, retail clinic, urgent care center, or emergency room.  If you have a medical emergency, please immediately call 911 or go to the emergency department.  Pager Numbers  - Dr. Bary Likes: [phone]  - Dr. Annette Barters: [phone]  - Dr. Felipe Horton: [phone]   In the event of inclement weather, please call our main line at [phone] for an update on the status of any delays or closures.  Dermatology Medication Tips: Please keep the boxes that topical medications come in in order to help keep track of the instructions about where and how to use these. Pharmacies typically print the medication instructions only on the boxes and not directly on the medication tubes.   If your medication is too expensive, please contact our office at [phone] option 4 or send us  a message through MyChart.   We are unable to tell what your co-pay for medications will be in advance as this is different depending on your insurance coverage. However, we may be able to find a substitute medication at lower cost or fill out paperwork to get insurance to cover a needed medication.   If a prior authorization is required to get your medication covered by your insurance company, please allow us  1-2 business days to complete this process.  Drug prices often vary depending on where the prescription is filled and some pharmacies may offer cheaper prices.  The website www.goodrx.com contains coupons for  medications through different pharmacies. The prices here do not account for what the cost may be with help from insurance (it may be cheaper with your insurance), but the website can give you the price if you did not use any insurance.  - You can print the associated coupon and take it with your prescription to the pharmacy.  - You may also stop by our office during regular business hours and pick up a GoodRx coupon card.  - If you need your prescription sent electronically to a  different pharmacy, notify our office through [facility] or by phone at [phone] option 4.     Si Usted Necesita Algo Despus de Su Visita  Tambin puede enviarnos un mensaje a travs de Clinical cytogeneticist. Por lo general respondemos a los mensajes de MyChart en el transcurso de 1 a 2 das hbiles.  Para renovar recetas, por favor pida a su farmacia que se ponga en contacto con nuestra oficina. Franz Jacks de fax es [place] [phone].  Si tiene un asunto urgente cuando la clnica est cerrada y que no puede esperar hasta el siguiente da hbil, puede llamar/localizar a su doctor(a) al nmero que aparece a continuacin.   Por favor, tenga en cuenta que aunque hacemos todo lo posible para estar disponibles para asuntos urgentes fuera del horario de [place], no estamos disponibles las 24 horas del da, los 7 [address]  [address] de la [place].   Si tiene un problema urgente y no puede comunicarse con nosotros, puede optar por buscar atencin mdica  en el consultorio de su doctor(a), en una clnica privada, en un centro de atencin urgente o en una sala de emergencias.  Si tiene Engineer, drilling, por favor llame inmediatamente al 911 o vaya a la sala de emergencias.  Nmeros de bper  - Dr. Bary Likes: [phone]  - Dra. Annette Barters: [phone]  - Dr. Felipe Horton: [phone]   En caso de inclemencias del tiempo, por favor llame a Lajuan Pila principal al [phone] para una actualizacin sobre el [place] de cualquier retraso o cierre.  Consejos para la medicacin en dermatologa: Por favor, guarde las cajas en las que vienen los medicamentos de uso tpico para ayudarle a seguir las instrucciones sobre dnde y cmo usarlos. Las farmacias generalmente imprimen las instrucciones del medicamento slo en las cajas y no directamente en los tubos del [place].   Si su medicamento es muy caro, por favor, pngase en contacto con Bettyjane Brunet llamando al [phone] y presione la opcin 4 o envenos un  mensaje a travs de Clinical cytogeneticist.   No podemos decirle cul ser su copago por los medicamentos por adelantado ya que esto es diferente dependiendo de la cobertura de su seguro. Sin embargo, es posible que podamos encontrar un medicamento sustituto a Audiological scientist un formulario para que el seguro cubra el medicamento que se considera necesario.   Si se requiere una autorizacin previa para que su compaa de seguros [place] su medicamento, por favor permtanos de 1 a 2 das hbiles para completar este proceso.  Los precios de los medicamentos varan con frecuencia dependiendo del Environmental consultant de dnde se surte la receta y alguna farmacias pueden ofrecer precios ms baratos.  El sitio web www.goodrx.com tiene cupones para medicamentos de Health and safety inspector. Los precios aqu no tienen en cuenta lo que podra costar con la ayuda del seguro (puede ser ms barato con su seguro), pero el sitio web puede darle el precio si no utiliz Tourist information centre manager.  - Puede imprimir el cupn correspondiente  y llevarlo con su receta a la farmacia.  - Tambin puede pasar por nuestra oficina durante el horario de atencin regular y Education officer, museum una tarjeta de cupones de GoodRx.  - Si necesita que su receta se enve electrnicamente a una farmacia diferente, informe a nuestra oficina a travs de MyChart de [place] o por telfono llamando al [phone] y presione la opcin 4.

## 2023-10-22 ENCOUNTER — Ambulatory Visit: Payer: Self-pay | Admitting: Dermatology

## 2023-10-22 LAB — ANAEROBIC AND AEROBIC CULTURE

## 2023-10-23 ENCOUNTER — Ambulatory Visit: Admitting: Dermatology

## 2023-12-10 ENCOUNTER — Other Ambulatory Visit: Payer: Self-pay | Admitting: Otolaryngology

## 2023-12-10 DIAGNOSIS — K091 Developmental (nonodontogenic) cysts of oral region: Secondary | ICD-10-CM

## 2023-12-19 ENCOUNTER — Ambulatory Visit
Admission: RE | Admit: 2023-12-19 | Discharge: 2023-12-19 | Disposition: A | Discharge status: Home / Self Care | Source: Ambulatory Visit | Attending: Otolaryngology | Admitting: Otolaryngology

## 2023-12-19 DIAGNOSIS — K091 Developmental (nonodontogenic) cysts of oral region: Secondary | ICD-10-CM

## 2024-01-15 ENCOUNTER — Other Ambulatory Visit: Payer: Self-pay | Admitting: Otolaryngology

## 2024-02-05 ENCOUNTER — Inpatient Hospital Stay: Admission: RE | Admit: 2024-02-05 | Source: Ambulatory Visit

## 2024-02-12 ENCOUNTER — Ambulatory Visit: Admit: 2024-02-12 | Admitting: Otolaryngology

## 2024-02-12 SURGERY — EXCISION, MASS, HEAD
Anesthesia: General | Laterality: Left

## 2024-03-17 ENCOUNTER — Ambulatory Visit: Admitting: Dermatology

## 2024-03-18 ENCOUNTER — Ambulatory Visit

## 2024-03-18 DIAGNOSIS — D239 Other benign neoplasm of skin, unspecified: Secondary | ICD-10-CM

## 2024-03-18 DIAGNOSIS — L989 Disorder of the skin and subcutaneous tissue, unspecified: Secondary | ICD-10-CM | POA: Diagnosis not present

## 2024-03-18 DIAGNOSIS — L810 Postinflammatory hyperpigmentation: Secondary | ICD-10-CM

## 2024-03-18 DIAGNOSIS — D2371 Other benign neoplasm of skin of right lower limb, including hip: Secondary | ICD-10-CM

## 2024-03-18 DIAGNOSIS — L811 Chloasma: Secondary | ICD-10-CM

## 2024-03-18 DIAGNOSIS — L728 Other follicular cysts of the skin and subcutaneous tissue: Secondary | ICD-10-CM | POA: Diagnosis not present

## 2024-03-18 MED ORDER — HYDROQUINONE 4 % EX CREA: TOPICAL_CREAM | Freq: Two times a day (BID) | CUTANEOUS | 0 refills | Status: DC

## 2024-03-18 MED ORDER — DOXYCYCLINE MONOHYDRATE 100 MG PO TABS
100.0000 mg | ORAL_TABLET | Freq: Two times a day (BID) | ORAL | 0 refills | Status: AC
Start: 2024-03-18 — End: ?

## 2024-03-18 MED ORDER — HYDROQUINONE 4 % EX CREA: TOPICAL_CREAM | Freq: Two times a day (BID) | CUTANEOUS | 0 refills | Status: AC

## 2024-03-18 NOTE — Patient Instructions (Addendum)
 Instructions for [facility] Medications  One or more of your medications was sent to the [facility] mail order compounding pharmacy. You will receive an email from them and can purchase the medicine through that link. It will then be mailed to your home at the address you confirmed. If for any reason you do not receive an email from them, please check your spam folder. If you still do not find the email, please let us  know. [facility] phone number is [phone].   Recommended sunscreens for face for patients with melasma: - La Roche-Posay Anthelios Mineral SPF 50 Sunscreen Tinted - SkinCeuticals Physical Fusion UV Defense Tinted Mineral - EltaMD UV Physical Tinted Facial Suncreen SPF 41 - Avene Mineral High Protection Tinted Compact SPF 50 -- this comes in darker tints  Due to recent changes in healthcare laws, you may see results of your pathology and/or laboratory studies on MyChart before the doctors have had a chance to review them. We understand that in some cases there may be results that are confusing or concerning to you. Please understand that not all results are received at the same time and often the doctors may need to interpret multiple results in order to provide you with the best plan of care or course of treatment. Therefore, we ask that you please give us  2 business days to thoroughly review all your results before contacting the office for clarification. Should we see a critical lab result, you will be contacted sooner.   If You Need Anything After Your Visit  If you have any questions or concerns for your doctor, please call our main line at [phone] and press option 4 to reach your doctor's medical assistant. If no one answers, please leave a voicemail as directed and we will return your call as soon as possible. Messages left after 4 pm will be answered the following business day.   You may also send us  a message via MyChart. We typically respond to MyChart  messages within 1-2 business days.  For prescription refills, please ask your pharmacy to contact our office. Our fax number is [phone].  If you have an urgent issue when the clinic is closed that cannot wait until the next business day, you can page your doctor at the number below.    Please note that while we do our best to be available for urgent issues outside of office hours, we are not available 24/7.   If you have an urgent issue and are unable to reach us , you may choose to seek medical care at your doctor's office, retail clinic, urgent care center, or emergency room.  If you have a medical emergency, please immediately call 911 or go to the emergency department.  Pager Numbers  - Dr. Hester: [phone]  - Dr. Jackquline: [phone]  - Dr. Claudene: [phone]   - Dr. Raymund: [phone]  In the event of inclement weather, please call our main line at [phone] for an update on the status of any delays or closures.  Dermatology Medication Tips: Please keep the boxes that topical medications come in in order to help keep track of the instructions about where and how to use these. Pharmacies typically print the medication instructions only on the boxes and not directly on the medication tubes.   If your medication is too expensive, please contact our office at [phone] option 4 or send us  a message through MyChart.   We are unable to tell what your co-pay for medications will be  in advance as this is different depending on your insurance coverage. However, we may be able to find a substitute medication at lower cost or fill out paperwork to get insurance to cover a needed medication.   If a prior authorization is required to get your medication covered by your insurance company, please allow us  1-2 business days to complete this process.  Drug prices often vary depending on where the prescription is filled and some pharmacies may offer cheaper prices.  The  website www.goodrx.com contains coupons for medications through different pharmacies. The prices here do not account for what the cost may be with help from insurance (it may be cheaper with your insurance), but the website can give you the price if you did not use any insurance.  - You can print the associated coupon and take it with your prescription to the pharmacy.  - You may also stop by our office during regular business hours and pick up a GoodRx coupon card.  - If you need your prescription sent electronically to a different pharmacy, notify our office through [facility] or by phone at [phone] option 4.     Si Usted Necesita Algo Despus de Su Visita  Tambin puede enviarnos un mensaje a travs de Clinical cytogeneticist. Por lo general respondemos a los mensajes de MyChart en el transcurso de 1 a 2 das hbiles.  Para renovar recetas, por favor pida a su farmacia que se ponga en contacto con nuestra oficina. Randi lakes de fax es [place] [phone].  Si tiene un asunto urgente cuando la clnica est cerrada y que no puede esperar hasta el siguiente da hbil, puede llamar/localizar a su doctor(a) al nmero que aparece a continuacin.   Por favor, tenga en cuenta que aunque hacemos todo lo posible para estar disponibles para asuntos urgentes fuera del horario de [place], no estamos disponibles las 24 horas del da, los 7 [address]  [address] de la [place].   Si tiene un problema urgente y no puede comunicarse con nosotros, puede optar por buscar atencin mdica  en el consultorio de su doctor(a), en una clnica privada, en un centro de atencin urgente o en una sala de emergencias.  Si tiene Engineer, drilling, por favor llame inmediatamente al 911 o vaya a la sala de emergencias.  Nmeros de bper  - Dr. Hester: [phone]  - Dra. Jackquline: [phone]  - Dr. Claudene: [phone]  - Dra. Kitts: [phone]  En caso de inclemencias del [place], por favor llame a nuestra lnea principal al  [phone] para una actualizacin sobre el estado de cualquier retraso o cierre.  Consejos para la medicacin en dermatologa: Por favor, guarde las cajas en las que vienen los medicamentos de uso tpico para ayudarle a seguir las instrucciones sobre dnde y cmo usarlos. Las farmacias generalmente imprimen las instrucciones del medicamento slo en las cajas y no directamente en los tubos del [place].   Si su medicamento es muy caro, por favor, pngase en contacto con landry rieger llamando al [phone] y presione la opcin 4 o envenos un mensaje a travs de Clinical cytogeneticist.   No podemos decirle cul ser su copago por los medicamentos por adelantado ya que esto es diferente dependiendo de la cobertura de su seguro. Sin embargo, es posible que podamos encontrar un medicamento sustituto a Audiological scientist un formulario para que el seguro cubra el medicamento que se considera necesario.   Si se requiere una autorizacin previa para que su compaa de seguros [place] su medicamento, por  favor permtanos de 1 a 2 das hbiles para completar [address]  Los precios de los medicamentos varan con frecuencia dependiendo del Environmental consultant de dnde se surte la receta y alguna farmacias pueden ofrecer precios ms baratos.  El sitio web www.goodrx.com tiene cupones para medicamentos de Health and safety inspector. Los precios aqu no tienen en cuenta lo que podra costar con la ayuda del seguro (puede ser ms barato con su seguro), pero el sitio web puede darle el precio si no utiliz Tourist information centre manager.  - Puede imprimir el cupn correspondiente y llevarlo con su receta a la farmacia.  - Tambin puede pasar por nuestra oficina durante el horario de atencin regular y Education officer, museum una tarjeta de cupones de GoodRx.  - Si necesita que su receta se enve electrnicamente a una farmacia diferente, informe a nuestra oficina a travs de MyChart de [place] o por telfono llamando al [phone] y presione la opcin 4.

## 2024-03-18 NOTE — Addendum Note (Signed)
 Addended by: NANCE HUG A on: 03/18/2024 05:23 PM   Modules accepted: Orders

## 2024-03-18 NOTE — Progress Notes (Signed)
    Subjective   Leslie Berger is a 38 y.o. female who presents for the following: Facial hyperpigmentation. Patient is established patient   Today patient reports: Rash w/hyperpigmentation on the face. Patient is not currently treating.  Area of concern on the buttock.   Review of Systems:    No other skin or systemic complaints except as noted in HPI or Assessment and Plan.  The following portions of the chart were reviewed this encounter and updated as appropriate: medications, allergies, medical history  Relevant Medical History:  n/a   Objective  (SKPE) Well appearing patient in no apparent distress; mood and affect are within normal limits. Examination was performed of the: face and buttock  Examination notable for: pih on face Cyst of L nasofacial sulcus. No tenderness or drainage.  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 Condition is symptomatic/ bothersome to patient. Not currently at goal. - Start doxycycline  100mg  oral BID x14 day - Discussed side effects and precautions with doxycycline  including taking with meal, waiting at least 30 minutes before lying down at night, increased sun sensitivity, and to stop medication if becomes pregnant or breastfeeding  Melasma vs Post inflammatory hyperpigmentation in the setting of acne  Chronic and persistent condition with duration or expected duration over one year. Condition is symptomatic/ bothersome to patient. Not currently at goal. - explained the etiology of the disorder, its difficulty to treat, and chronic nature - Strict sun protection with sun avoidance and tinted broad-spectrum sunscreen (with visible light protection), ideally SPF 50+, but at least 30+ (provided handout with recommendations). Warned that even  10 minutes of unprotected sun exposure while clear can flare melasma - start compounded topical medication (Hydroquinone 8%, Tretinoin  0.025%, Kojic Acid 1%, Niacinamide 4%, Fluocinolone 0.025% Cream) from [employer] on proper use; daily for 3 months, then break  Educated on skin irritation and risk of exogenous ochronosis if used inappropriately or for long periods of time (>6 months) Also educated on possible risks of colloid milium formation, xeroderma, hypopigmentation, and theoretical fetal risk  Dermatofibroma of the right lateral hip  - Reassured the patient that this is a benign scar like reaction that can occur spontaneously or after trauma.  - Lesions are asymptomatic, therefore no treatment is required.  - Instructed to let us  know if lesions become painful or bothersome.    Level of service outlined above   Patient instructions (SKPI)   Procedures, orders, diagnosis for this visit:    There are no diagnoses linked to this encounter.  Return to clinic: Return in about 3 months (around 06/18/2024) for Melasma.  I, Emerick Ege, CMA am acting as scribe for Lauraine JAYSON Kanaris, MD.   Documentation: I have reviewed the above documentation for accuracy and completeness, and I agree with the above.  Lauraine JAYSON Kanaris, MD

## 2024-04-03 ENCOUNTER — Ambulatory Visit: Admitting: Dermatology

## 2024-04-09 ENCOUNTER — Other Ambulatory Visit: Payer: Self-pay | Admitting: Otolaryngology

## 2024-06-18 ENCOUNTER — Ambulatory Visit

## 2024-06-24 ENCOUNTER — Ambulatory Visit: Admit: 2024-06-24 | Admitting: Otolaryngology
